# Patient Record
Sex: Male | Born: 1965 | Race: White | Hispanic: No | Marital: Married | Smoking: Current every day smoker
Health system: Southern US, Community
[De-identification: ages and names within clinical notes are randomized; demographics above are authoritative.]

## PROBLEM LIST (undated history)

## (undated) ENCOUNTER — Emergency Department: Admission: EM | Discharge status: Absent without leave | Payer: Self-pay

## (undated) DIAGNOSIS — F32A Depression, unspecified: Secondary | ICD-10-CM

## (undated) DIAGNOSIS — F1910 Other psychoactive substance abuse, uncomplicated: Secondary | ICD-10-CM

## (undated) DIAGNOSIS — R972 Elevated prostate specific antigen [PSA]: Secondary | ICD-10-CM

## (undated) DIAGNOSIS — R7303 Prediabetes: Secondary | ICD-10-CM

## (undated) DIAGNOSIS — Z8639 Personal history of other endocrine, nutritional and metabolic disease: Secondary | ICD-10-CM

## (undated) DIAGNOSIS — I10 Essential (primary) hypertension: Secondary | ICD-10-CM

## (undated) DIAGNOSIS — G709 Myoneural disorder, unspecified: Secondary | ICD-10-CM

## (undated) DIAGNOSIS — I739 Peripheral vascular disease, unspecified: Secondary | ICD-10-CM

## (undated) DIAGNOSIS — F1010 Alcohol abuse, uncomplicated: Secondary | ICD-10-CM

## (undated) HISTORY — PX: TONSILLECTOMY: SUR1361

## (undated) HISTORY — DX: Elevated prostate specific antigen (PSA): R97.20

## (undated) HISTORY — PX: NO PAST SURGERIES: SHX2092

---

## 2001-05-03 ENCOUNTER — Emergency Department (HOSPITAL_COMMUNITY): Admission: EM | Admit: 2001-05-03 | Discharge: 2001-05-03 | Payer: Self-pay | Admitting: Emergency Medicine

## 2007-09-11 ENCOUNTER — Ambulatory Visit: Payer: Self-pay | Admitting: Internal Medicine

## 2007-09-11 DIAGNOSIS — N400 Enlarged prostate without lower urinary tract symptoms: Secondary | ICD-10-CM

## 2008-05-09 ENCOUNTER — Ambulatory Visit: Payer: Self-pay | Admitting: Internal Medicine

## 2008-05-09 DIAGNOSIS — K625 Hemorrhage of anus and rectum: Secondary | ICD-10-CM

## 2008-05-09 DIAGNOSIS — R319 Hematuria, unspecified: Secondary | ICD-10-CM

## 2008-05-09 DIAGNOSIS — F17200 Nicotine dependence, unspecified, uncomplicated: Secondary | ICD-10-CM | POA: Insufficient documentation

## 2008-05-10 ENCOUNTER — Encounter (INDEPENDENT_AMBULATORY_CARE_PROVIDER_SITE_OTHER): Payer: Self-pay | Admitting: *Deleted

## 2008-05-12 ENCOUNTER — Encounter (INDEPENDENT_AMBULATORY_CARE_PROVIDER_SITE_OTHER): Payer: Self-pay | Admitting: *Deleted

## 2008-09-22 ENCOUNTER — Ambulatory Visit: Payer: Self-pay | Admitting: Internal Medicine

## 2008-09-22 DIAGNOSIS — F329 Major depressive disorder, single episode, unspecified: Secondary | ICD-10-CM | POA: Insufficient documentation

## 2009-05-05 ENCOUNTER — Telehealth (INDEPENDENT_AMBULATORY_CARE_PROVIDER_SITE_OTHER): Payer: Self-pay | Admitting: *Deleted

## 2009-05-09 ENCOUNTER — Telehealth (INDEPENDENT_AMBULATORY_CARE_PROVIDER_SITE_OTHER): Payer: Self-pay | Admitting: *Deleted

## 2009-05-15 ENCOUNTER — Telehealth (INDEPENDENT_AMBULATORY_CARE_PROVIDER_SITE_OTHER): Payer: Self-pay | Admitting: *Deleted

## 2011-01-27 LAB — CONVERTED CEMR LAB
AST: 24 units/L (ref 0–37)
Alkaline Phosphatase: 70 units/L (ref 39–117)
Basophils Absolute: 0.1 10*3/uL (ref 0.0–0.1)
Bilirubin, Direct: 0.1 mg/dL (ref 0.0–0.3)
Chloride: 106 meq/L (ref 96–112)
Eosinophils Absolute: 0.1 10*3/uL (ref 0.0–0.7)
GFR calc non Af Amer: 100 mL/min
HDL: 31.1 mg/dL — ABNORMAL LOW (ref >39.0)
Hgb A1c MFr Bld: 5.5 % (ref 4.6–6.0)
Lymphocytes Relative: 27.3 % (ref 12.0–46.0)
MCHC: 33.7 g/dL (ref 30.0–36.0)
MCV: 95.8 fL (ref 78.0–100.0)
Neutrophils Relative %: 60.9 % (ref 43.0–77.0)
Platelets: 266 10*3/uL (ref 150–400)
Potassium: 3.7 meq/L (ref 3.5–5.1)
Sodium: 139 meq/L (ref 135–145)
Total Bilirubin: 0.8 mg/dL (ref 0.3–1.2)
Triglycerides: 136 mg/dL (ref 0–149)
VLDL: 27 mg/dL (ref 0–40)
WBC: 4.7 10*3/uL (ref 4.5–10.5)

## 2016-01-15 ENCOUNTER — Ambulatory Visit (INDEPENDENT_AMBULATORY_CARE_PROVIDER_SITE_OTHER): Payer: BLUE CROSS/BLUE SHIELD

## 2016-01-15 ENCOUNTER — Ambulatory Visit (INDEPENDENT_AMBULATORY_CARE_PROVIDER_SITE_OTHER): Payer: BLUE CROSS/BLUE SHIELD | Admitting: Sports Medicine

## 2016-01-15 VITALS — BP 148/97 | HR 117 | Temp 98.0°F | Resp 18 | Wt 184.7 lb

## 2016-01-15 DIAGNOSIS — S59902A Unspecified injury of left elbow, initial encounter: Secondary | ICD-10-CM | POA: Diagnosis not present

## 2016-01-15 DIAGNOSIS — M25522 Pain in left elbow: Secondary | ICD-10-CM | POA: Diagnosis not present

## 2016-01-15 NOTE — Progress Notes (Addendum)
   Subjective:    I'm seeing this patient as a consultation for:  Dr. Marga MelnickWilliam Hopper  CC: Left elbow injury  HPI: Yesterday this pleasant 50 year old male fell backwards onto his left elbow impacting just proximal to the olecranon. He had immediate pain, but swelling and bruising came subsequently. No constitutional symptoms, however has exquisite pain and swelling proximal to the olecranon over the distal triceps. Pain does not radiate. Made better by rest, worse with resisted extension of the elbow.  Past medical history, Surgical history, Family history not pertinant except as noted below, Social history, Allergies, and medications have been entered into the medical record, reviewed, and no changes needed.   Review of Systems: No headache, visual changes, nausea, vomiting, diarrhea, constipation, dizziness, abdominal pain, skin rash, fevers, chills, night sweats, weight loss, swollen lymph nodes, body aches, joint swelling, muscle aches, chest pain, shortness of breath, mood changes, visual or auditory hallucinations.   Objective:   General: Well Developed, well nourished, and in no acute distress.  Neuro/Psych: Alert and oriented x3, extra-ocular muscles intact, able to move all 4 extremities, sensation grossly intact. Skin: Warm and dry, no rashes noted.  Respiratory: Not using accessory muscles, speaking in full sentences, trachea midline.  Cardiovascular: Pulses palpable, no extremity edema. Abdomen: Does not appear distended. Left Elbow: Visibly swelling proximal to the olecranon over the distal triceps, able to flex the elbow actively, but unable to extend against resistance. No palpable elbow joint effusion. Range of motion full pronation, supination, flexion, extension. Stable to varus, valgus stress. Negative moving valgus stress test. No discrete areas of tenderness to palpation. Ulnar nerve does not sublux. Negative cubital tunnel Tinel's.  Procedure: Diagnostic Ultrasound  of  left elbow Device: GE Logiq E  Findings: Noted hypoechoic change around the distal triceps tendon, there is also an interruption of the normal pennate structure of the distal triceps at the distal lateral head,  no visible gaps in the tendon.  Images permanently stored and available for review in the ultrasound unit.  Impression: Distal triceps tendinitis with possible partial tear of the distal lateral head of the triceps at the musculotendinous junction, no joint effusion to suggest articular injury, and no visible olecranon bursa collection.   Procedure: Real-time Ultrasound Guided Injection of left triceps tendon sheath Device: GE Logiq E  Verbal informed consent obtained.  Time-out conducted.  Noted no overlying erythema, induration, or other signs of local infection.  Skin prepped in a sterile fashion.  Local anesthesia: Topical Ethyl chloride.  With sterile technique and under real time ultrasound guidance:  25-gauge daily advanced into the tendon sheath effusion , 1 mL kenalog 40, 2 mL lidocaine, 2 mL Marcaine injected easily. Completed without difficulty  Pain immediately resolved suggesting accurate placement of the medication. Patient also regained good ability to extend the elbow against resistance  Advised to call if fevers/chills, erythema, induration, drainage, or persistent bleeding.  Images permanently stored and available for review in the ultrasound unit.  Impression: Technically successful ultrasound guided injection.  Impression and Recommendations:   This case required medical decision making of moderate complexity.

## 2016-01-15 NOTE — Assessment & Plan Note (Addendum)
No visible fractures but there is what appears to be triceps tendinitis distally, with some partial tearing. Injection performed, elbow sleeve, sling. Good ability to extend the elbow against resistance after the injection suggests no high-grade tear of the triceps. Home PT. Return in one month.

## 2016-02-12 ENCOUNTER — Ambulatory Visit: Payer: BLUE CROSS/BLUE SHIELD | Admitting: Sports Medicine

## 2016-07-05 ENCOUNTER — Emergency Department (INDEPENDENT_AMBULATORY_CARE_PROVIDER_SITE_OTHER): Payer: BLUE CROSS/BLUE SHIELD

## 2016-07-05 ENCOUNTER — Encounter: Payer: Self-pay | Admitting: Emergency Medicine

## 2016-07-05 ENCOUNTER — Emergency Department (INDEPENDENT_AMBULATORY_CARE_PROVIDER_SITE_OTHER)
Admission: EM | Admit: 2016-07-05 | Discharge: 2016-07-05 | Disposition: A | Discharge status: Home / Self Care | Payer: BLUE CROSS/BLUE SHIELD | Source: Home / Self Care | Attending: Family Medicine | Admitting: Family Medicine

## 2016-07-05 DIAGNOSIS — M542 Cervicalgia: Secondary | ICD-10-CM

## 2016-07-05 DIAGNOSIS — M25512 Pain in left shoulder: Secondary | ICD-10-CM | POA: Diagnosis not present

## 2016-07-05 MED ORDER — HYDROCODONE-ACETAMINOPHEN 5-325 MG PO TABS: ORAL_TABLET | ORAL | Status: DC

## 2016-07-05 MED ORDER — PREDNISONE 20 MG PO TABS: ORAL_TABLET | ORAL | Status: DC

## 2016-07-05 NOTE — ED Provider Notes (Signed)
CSN: [id number]     Arrival date & time 07/05/16  1358 History   First MD Initiated Contact with Patient 07/05/16 1400     Chief Complaint  Patient presents with  . Weakness      HPI Comments: About one week ago patient "pulled a muscle" in his left scapular area.  The pain initially was intermittent, and is now a constant ache radiating into his left shoulder and arm.   He denies chest pain, nausea, or shortness of breath.  His pain is independent of activity and awakens him at night.  He states that his BP is normally lower than today's measurement.  He is worried about the possibility of heart disease. He currently smokes 1 ppd cigarettes. Family history of stroke in a maternal grandmother.  Patient is a 50 y.o. male presenting with shoulder pain. The history is provided by the patient.  Shoulder Pain Location:  Shoulder Time since incident:  1 week Injury: yes   Mechanism of injury comment:  Muscle strain Shoulder location:  L shoulder Pain details:    Quality:  Aching   Radiates to:  L arm (left neck)   Severity:  Mild   Onset quality:  Sudden   Duration:  1 week   Timing:  Constant   Progression:  Worsening Chronicity:  New Prior injury to area:  No Relieved by:  Nothing Worsened by:  Movement Ineffective treatments:  None tried Associated symptoms: fatigue, neck pain and stiffness   Associated symptoms: no back pain, no decreased range of motion, no fever, no numbness, no swelling and no tingling     History reviewed. No pertinent past medical history. History reviewed. No pertinent past surgical history. No family history on file. Social History  Substance Use Topics  . Smoking status: Current Every Day Smoker -- 1.00 packs/day for 30 years    Types: Cigarettes  . Smokeless tobacco: None  . Alcohol Use: Yes    Review of Systems  Constitutional: Positive for fatigue. Negative for fever, chills, diaphoresis, activity change and appetite change.       Feels "hot"    HENT: Negative.   Eyes: Negative.   Respiratory: Negative for cough, chest tightness, shortness of breath and wheezing.   Cardiovascular: Negative for chest pain, palpitations and leg swelling.  Gastrointestinal: Negative.   Genitourinary: Negative.   Musculoskeletal: Positive for stiffness, neck pain and neck stiffness. Negative for back pain.  Skin: Negative.   Neurological: Negative for headaches.  All other systems reviewed and are negative.   Allergies  Tetanus toxoid  Home Medications   Prior to Admission medications   Medication Sig Start Date End Date Taking? Authorizing Provider  HYDROcodone-acetaminophen (NORCO/VICODIN) 5-325 MG tablet Take one by mouth at bedtime as needed for pain 07/05/16   Lattie HawStephen A Sutton Hirsch, MD  PARoxetine HCl (PAXIL PO) Take by mouth.    Historical Provider, MD  predniSONE (DELTASONE) 20 MG tablet Take one tab by mouth twice daily for 5 days, then one daily. Take with food. 07/05/16   Lattie HawStephen A Luddie Boghosian, MD   Meds Ordered and Administered this Visit  Medications - No data to display  BP 181/111 mmHg  Pulse 103  Temp(Src) 98.2 F (36.8 C) (Oral)  Ht 6\' 1"  (1.854 m)  Wt 188 lb (85.276 kg)  BMI 24.81 kg/m2  SpO2 97% No data found.   Physical Exam  Constitutional: He is oriented to person, place, and time. He appears well-developed and well-nourished. No distress.  HENT:  Head: Normocephalic.  Right Ear: External ear normal.  Left Ear: External ear normal.  Nose: Nose normal.  Mouth/Throat: Oropharynx is clear and moist.  Eyes: Conjunctivae are normal. Pupils are equal, round, and reactive to light.  Neck: Neck supple. Decreased range of motion present.    Left neck has tenderness to palpation on the left radiating to left trapezius and left rhomboid area as noted on diagram.    Cardiovascular: Normal heart sounds.   Pulmonary/Chest: Breath sounds normal.  Abdominal: There is no tenderness.  Musculoskeletal: He exhibits no edema.       Left  shoulder: Normal.       Back:  There is distinct tenderness over medial and inferior edges of left scapula.  Pain elicited by resisted abduction of left shoulder while palpating left rhomboid muscles.   Lymphadenopathy:    He has no cervical adenopathy.  Neurological: He is alert and oriented to person, place, and time. No cranial nerve deficit.  Skin: Skin is warm and dry. No rash noted.  Nursing note and vitals reviewed.   ED Course  Procedures none  EKG: Rate:  118 BPM PR:  152 msec QT:  322 msec QTcH:  423 msec QRSD:  101 msec QRS axis:  -19 degrees Interpretation:  Sinus tachycardia, otherwise within normal limits.     Comparison with EKG done 05/09/2008:  No significant changes    Imaging Review Dg Chest 2 View  07/05/2016  CLINICAL DATA:  Left neck and shoulder pain. EXAM: CHEST  2 VIEW COMPARISON:  None. FINDINGS: Normal heart size. Normal mediastinal contour. No pneumothorax. No pleural effusion. Biapical symmetric pleural parenchymal scarring. No pulmonary edema. No acute consolidative airspace disease. Mild thoracic spondylosis. IMPRESSION: No active cardiopulmonary disease. Electronically Signed   By: Delbert PhenixJason A Poff M.D.   On: 07/05/2016 14:44   Dg Cervical Spine Complete  07/05/2016  CLINICAL DATA:  Left shoulder pain, left neck pain for 1 week, no known injury EXAM: CERVICAL SPINE - COMPLETE 4+ VIEW COMPARISON:  None FINDINGS: Six views of the cervical spine submitted. No acute fracture or subluxation there is moderate disc space flattening with mild anterior spurring at C3-C4 and C6-C7 level. Minimal disc space flattening at C4-C5 and C5-C6 level. No prevertebral soft tissue swelling. Cervical airway is patent. Mild degenerative changes C1-C2 articulation. IMPRESSION: No acute fracture or subluxation. Multilevel degenerative changes as described above. Electronically Signed   By: Natasha MeadLiviu  Pop M.D.   On: 07/05/2016 15:01      MDM   1. Shoulder pain, left   2. Neck pain on  left side; ?cervical radiculopathy    No evidence ACS.  Normal EKG today reassuring.  However patient has significant risk factors for ASCVD.  Repeat BP 142/91.  Recommend monitoring BP and recording.  Followup with PCP if remains elevated. Begin prednisone burst/taper.  Lortab for pain at bedtime. Apply ice pack to left neck and shoulder for 15 to 20 minutes, 3 to 4 times daily  Continue until pain decreases.  Followup with Dr. Rodney Langtonhomas Thekkekandam or Dr. Clementeen GrahamEvan Corey ([facility]) if neck/shoulder pain not improving about two weeks.  Recommend follow-up with cardiologist for exercise stress test.    Lattie HawStephen A Kirkland Figg, MD 07/14/16 1226

## 2016-07-05 NOTE — Discharge Instructions (Signed)
Apply ice pack to left neck and shoulder for 15 to 20 minutes, 3 to 4 times daily  Continue until pain decreases.    Radicular Pain Radicular pain in either the arm or leg is usually from a bulging or herniated disk in the spine. A piece of the herniated disk may press against the nerves as the nerves exit the spine. This causes pain which is felt at the tips of the nerves down the arm or leg. Other causes of radicular pain may include:  Fractures.  Heart disease.  Cancer.  An abnormal and usually degenerative state of the nervous system or nerves (neuropathy). Diagnosis may require CT or MRI scanning to determine the primary cause.  Nerves that start at the neck (nerve roots) may cause radicular pain in the outer shoulder and arm. It can spread down to the thumb and fingers. The symptoms vary depending on which nerve root has been affected. In most cases radicular pain improves with conservative treatment. Neck problems may require physical therapy, a neck collar, or cervical traction. Treatment may take many weeks, and surgery may be considered if the symptoms do not improve.  Conservative treatment is also recommended for sciatica. Sciatica causes pain to radiate from the lower back or buttock area down the leg into the foot. Often there is a history of back problems. Most patients with sciatica are better after 2 to 4 weeks of rest and other supportive care. Short term bed rest can reduce the disk pressure considerably. Sitting, however, is not a good position since this increases the pressure on the disk. You should avoid bending, lifting, and all other activities which make the problem worse. Traction can be used in severe cases. Surgery is usually reserved for patients who do not improve within the first months of treatment. Only take over-the-counter or prescription medicines for pain, discomfort, or fever as directed by your caregiver. Narcotics and muscle relaxants may help by relieving  more severe pain and spasm and by providing mild sedation. Cold or massage can give significant relief. Spinal manipulation is not recommended. It can increase the degree of disc protrusion. Epidural steroid injections are often effective treatment for radicular pain. These injections deliver medicine to the spinal nerve in the space between the protective covering of the spinal cord and back bones (vertebrae). Your caregiver can give you more information about steroid injections. These injections are most effective when given within two weeks of the onset of pain.  You should see your caregiver for follow up care as recommended. A program for neck and back injury rehabilitation with stretching and strengthening exercises is an important part of management.  SEEK IMMEDIATE MEDICAL CARE IF:  You develop increased pain, weakness, or numbness in your arm or leg.  You develop difficulty with bladder or bowel control.  You develop abdominal pain.   This information is not intended to replace advice given to you by your health care provider. Make sure you discuss any questions you have with your health care provider.   Document Released: 01/23/2005 Document Revised: 01/06/2015 Document Reviewed: 07/12/2015 Elsevier Interactive Patient Education [employer]2016 [employer] Inc[employer]

## 2016-07-05 NOTE — ED Notes (Signed)
About one week ago started having left shoulder pain, today radiating down into left arm and weakness. Denies chest pain, nausea, BP is elevated

## 2018-11-04 ENCOUNTER — Encounter: Payer: Self-pay | Admitting: Emergency Medicine

## 2018-11-04 ENCOUNTER — Other Ambulatory Visit: Payer: Self-pay

## 2018-11-04 ENCOUNTER — Emergency Department (INDEPENDENT_AMBULATORY_CARE_PROVIDER_SITE_OTHER)
Admission: EM | Admit: 2018-11-04 | Discharge: 2018-11-04 | Disposition: A | Discharge status: Home / Self Care | Payer: BLUE CROSS/BLUE SHIELD | Source: Home / Self Care | Attending: Family Medicine | Admitting: Family Medicine

## 2018-11-04 DIAGNOSIS — R1011 Right upper quadrant pain: Secondary | ICD-10-CM | POA: Diagnosis not present

## 2018-11-04 DIAGNOSIS — R197 Diarrhea, unspecified: Secondary | ICD-10-CM

## 2018-11-04 DIAGNOSIS — R531 Weakness: Secondary | ICD-10-CM

## 2018-11-04 NOTE — ED Triage Notes (Signed)
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  He has been suffering from diarrhea, weakness, and RLQ discomfort for several months.  Pt is concerned for liver damage, stating he woke up this morning with his eyes looking yellow.

## 2018-11-04 NOTE — Discharge Instructions (Signed)
°  Your labs should results by tomorrow. You will be notified even if they are all normal.  Please follow up with your family medicine doctor as needed.

## 2018-11-04 NOTE — ED Provider Notes (Signed)
[facility]    CSN: [id number] Arrival date & time: 11/04/18  0948     History   Chief Complaint Chief Complaint  Patient presents with  . Weakness  . Abdominal Pain  . Diarrhea    HPI Julian Simmons. is a 52 y.o. male.   HPI  Julian Simmons. is a 52 y.o. male presenting to UC with c/o several months of intermittent watery diarrhea, generalized weakness and Right side abdominal pain, discomfort.  He reports being a covering alcoholic, sober for 2 years. He is concerned he has liver damage from his hx of heavy drinking. He thought his eyes looked yellow this morning.  Denies abdominal pain or nausea at this time. He does have a PCP but has not addressed these concerns with him.    History reviewed. No pertinent past medical history.  Patient Active Problem List   Diagnosis Date Noted  . Injury of left elbow 01/15/2016  . DEPRESSIVE DISORDER 09/22/2008  . CIGARETTE SMOKER 05/09/2008  . RECTAL BLEEDING 05/09/2008  . HEMATURIA 05/09/2008  . 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 No pertinent surgical history.     Home Medications    Prior to Admission medications   Medication Sig Start Date End Date Taking? Authorizing Provider  PARoxetine HCl (PAXIL PO) Take by mouth.   Yes [provider]    Family History History reviewed. No pertinent family history.  Social History Social History   Tobacco Use  . Smoking status: Current Every Day Smoker    Packs/day: 1.00    Years: 30.00    Pack years: 30.00    Types: Cigarettes  . Smokeless tobacco: Never Used  Substance Use Topics  . Alcohol use: Not Currently    Comment: Pt quit 2 years ago  . Drug use: Not on file     Allergies   Tetanus toxoid   Review of Systems Review of Systems  Constitutional: Positive for fatigue. Negative for appetite change, chills, diaphoresis, fever and unexpected weight change.  Gastrointestinal: Positive for  abdominal pain and diarrhea. Negative for nausea and vomiting.  Neurological: Positive for weakness. Negative for dizziness, seizures, light-headedness, numbness and headaches.     Physical Exam Triage Vital Signs ED Triage Vitals [11/04/18 1007]  Enc Vitals Group     BP 139/90     Pulse Rate (!) 107     Resp      Temp 98.3 F (36.8 C)     Temp Source Oral     SpO2 98 %     Weight 189 lb 4 oz (85.8 kg)     Height 6\' 1"  (1.854 m)     Head Circumference      Peak Flow      Pain Score 2     Pain Loc      Pain Edu?      Excl. in GC?    No data found.  Updated Vital Signs BP 139/90 (BP Location: Right Arm)   Pulse (!) 107   Temp 98.3 F (36.8 C) (Oral)   Ht 6\' 1"  (1.854 m)   Wt 189 lb 4 oz (85.8 kg)   SpO2 98%   BMI 24.97 kg/m   Visual Acuity Right Eye Distance:   Left Eye Distance:   Bilateral Distance:    Right Eye Near:   Left Eye Near:    Bilateral Near:     Physical Exam  Constitutional: He  is oriented to person, place, and time. He appears well-developed and well-nourished.  HENT:  Head: Normocephalic and atraumatic.  Mouth/Throat: Oropharynx is clear and moist.  Eyes: EOM are normal.  Neck: Normal range of motion.  Cardiovascular: Normal rate and regular rhythm.  Pulmonary/Chest: Effort normal and breath sounds normal.  Abdominal: Soft. Normal appearance. There is no splenomegaly or hepatomegaly. There is no tenderness. There is no CVA tenderness and negative Murphy's sign.  Musculoskeletal: Normal range of motion.  Neurological: He is alert and oriented to person, place, and time.  Skin: Skin is warm and dry.  Psychiatric: He has a normal mood and affect. His behavior is normal.  Nursing note and vitals reviewed.    UC Treatments / Results  Labs (all labs ordered are listed, but only abnormal results are displayed) Labs Reviewed  CBC WITH DIFFERENTIAL/PLATELET  COMPLETE METABOLIC PANEL WITH GFR  LIPASE    EKG None  Radiology No results  found.  Procedures Procedures (including critical care time)  Medications Ordered in UC Medications - No data to display  Initial Impression / Assessment and Plan / UC Course  I have reviewed the triage vital signs and the nursing notes.  Pertinent labs & imaging results that were available during my care of the patient were reviewed by me and considered in my medical decision making (see chart for details).    Normal exam, no evidence of acute abdomen at this time. Will order labs and f/u with pt.   Final Clinical Impressions(s) / UC Diagnoses   Final diagnoses:  Right upper quadrant abdominal pain  Diarrhea, unspecified type  Weakness     Discharge Instructions      Your labs should results by tomorrow. You will be notified even if they are all normal.  Please follow up with your family medicine doctor as needed.     ED Prescriptions    None     Controlled Substance Prescriptions [place] Controlled Substance Registry consulted? Not Applicable   Rolla Plate 11/04/18 1105

## 2018-11-05 ENCOUNTER — Telehealth: Payer: Self-pay

## 2018-11-05 LAB — CBC WITH DIFFERENTIAL/PLATELET
Basophils Absolute: 30 cells/uL (ref 0–200)
Basophils Relative: 0.6 %
Eosinophils Absolute: 40 cells/uL (ref 15–500)
Eosinophils Relative: 0.8 %
HCT: 45.1 % (ref 38.5–50.0)
Hemoglobin: 15.9 g/dL (ref 13.2–17.1)
Lymphs Abs: 1040 cells/uL (ref 850–3900)
MCH: 30.3 pg (ref 27.0–33.0)
MCHC: 35.3 g/dL (ref 32.0–36.0)
MCV: 85.9 fL (ref 80.0–100.0)
MPV: 11.2 fL (ref 7.5–12.5)
Monocytes Relative: 6.5 %
Neutro Abs: 3565 cells/uL (ref 1500–7800)
Neutrophils Relative %: 71.3 %
Platelets: 132 10*3/uL — ABNORMAL LOW (ref 140–400)
RBC: 5.25 10*6/uL (ref 4.20–5.80)
RDW: 12.9 % (ref 11.0–15.0)
Total Lymphocyte: 20.8 %
WBC mixed population: 325 cells/uL (ref 200–950)
WBC: 5 10*3/uL (ref 3.8–10.8)

## 2018-11-05 LAB — COMPLETE METABOLIC PANEL WITH GFR
AG Ratio: 2 (calc) (ref 1.0–2.5)
ALT: 25 U/L (ref 9–46)
AST: 22 U/L (ref 10–35)
Albumin: 4.7 g/dL (ref 3.6–5.1)
Alkaline phosphatase (APISO): 80 U/L (ref 40–115)
BUN: 10 mg/dL (ref 7–25)
CO2: 23 mmol/L (ref 20–32)
Calcium: 9.8 mg/dL (ref 8.6–10.3)
Chloride: 101 mmol/L (ref 98–110)
Creat: 1.13 mg/dL (ref 0.70–1.33)
GFR, Est African American: 87 mL/min/{1.73_m2} (ref >=60)
GFR, Est Non African American: 75 mL/min/{1.73_m2} (ref >=60)
Globulin: 2.4 g/dL (calc) (ref 1.9–3.7)
Glucose, Bld: 166 mg/dL — ABNORMAL HIGH (ref 65–99)
Potassium: 4.2 mmol/L (ref 3.5–5.3)
Sodium: 133 mmol/L — ABNORMAL LOW (ref 135–146)
Total Bilirubin: 0.3 mg/dL (ref 0.2–1.2)
Total Protein: 7.1 g/dL (ref 6.1–8.1)

## 2018-11-05 LAB — LIPASE: Lipase: 20 U/L (ref 7–60)

## 2018-11-05 NOTE — Telephone Encounter (Signed)
Pt states he is feeling about the same. No worsening of symptoms. Given lab results. Advised to f/u with PCP in a couple days if he feels no improvement.

## 2019-10-15 ENCOUNTER — Ambulatory Visit (INDEPENDENT_AMBULATORY_CARE_PROVIDER_SITE_OTHER): Payer: BC Managed Care – PPO | Admitting: Urology

## 2019-10-15 ENCOUNTER — Encounter: Payer: Self-pay | Admitting: Urology

## 2019-10-15 ENCOUNTER — Other Ambulatory Visit: Payer: Self-pay

## 2019-10-15 VITALS — BP 154/99 | HR 106 | Ht 73.0 in | Wt 182.6 lb

## 2019-10-15 DIAGNOSIS — R972 Elevated prostate specific antigen [PSA]: Secondary | ICD-10-CM

## 2019-10-15 NOTE — Progress Notes (Signed)
10/15/2019 3:11 PM   Julian Simmons. 1966-06-30 [id number]  Referring provider: Tamsen Roers, [place],  [place] [address]  Chief Complaint  Patient presents with  . Elevated PSA    HPI: Julian Simmons is a 53 y.o. male seen in consultation at the request of Dr. Rex Kras for a PSA of 3.9 which was drawn on August 2020.  For the past 6 months he has noted moderate lower urinary tract symptoms including decreased force and caliber of urinary stream and nocturia x3.  IPSS completed today was 10/35 with a QoL rated 3/6.  Denies dysuria or gross hematuria.  He has no flank, abdominal, pelvic or scrotal pain.  No family history of prostate cancer.  Past urologic history remarkable for a "prostate infection" in his 1s and microhematuria evaluation approximately 20 years ago in [place].  He had cystoscopy x2 which was unremarkable.  PMH: Past Medical History:  Diagnosis Date  . Elevated PSA     Surgical History: No past surgical history on file.  Home Medications:  Allergies as of 10/15/2019      Reactions   Tetanus Toxoid       Medication List       Accurate as of October 15, 2019  3:11 PM. If you have any questions, ask your nurse or doctor.        hydrochlorothiazide 12.5 MG capsule Commonly known as: MICROZIDE TAKE 1 CAPSULE BY MOUTH ONCE DAILY IN THE MORNING   lisinopril 5 MG tablet Commonly known as: ZESTRIL Take 5 mg by mouth daily.   lovastatin 10 MG tablet Commonly known as: MEVACOR TAKE 1 TABLET BY MOUTH ONCE DAILY WITH EVENING MEAL   PARoxetine 30 MG tablet Commonly known as: PAXIL Take 30 mg by mouth daily.   sildenafil 100 MG tablet Commonly known as: VIAGRA Take 100 mg by mouth as needed.   temazepam 15 MG capsule Commonly known as: RESTORIL TAKE 1 CAPSULE BY MOUTH ONCE DAILY AT BEDTIME AS NEEDED       Allergies:  Allergies  Allergen Reactions  . Tetanus Toxoid     Family History: No family history on file.  Social  History:  reports that he has been smoking cigarettes. He has a 30.00 pack-year smoking history. He has never used smokeless tobacco. He reports current alcohol use. He reports that he does not use drugs.  ROS: UROLOGY Frequent Urination?: No Hard to postpone urination?: No Burning/pain with urination?: No Get up at night to urinate?: Yes Leakage of urine?: No Urine stream starts and stops?: Yes Trouble starting stream?: No Do you have to strain to urinate?: No Blood in urine?: No Urinary tract infection?: No Sexually transmitted disease?: No Injury to kidneys or bladder?: No Painful intercourse?: No Weak stream?: Yes Erection problems?: No Penile pain?: No  Gastrointestinal Nausea?: No Vomiting?: No Indigestion/heartburn?: No Diarrhea?: No Constipation?: No  Constitutional Fever: No Night sweats?: No Weight loss?: No Fatigue?: No  Skin Skin rash/lesions?: No Itching?: No  Eyes Blurred vision?: No Double vision?: No  Ears/Nose/Throat Sore throat?: No Sinus problems?: No  Hematologic/Lymphatic Swollen glands?: No Easy bruising?: No  Cardiovascular Leg swelling?: No Chest pain?: No  Respiratory Cough?: No Shortness of breath?: No  Endocrine Excessive thirst?: No  Musculoskeletal Back pain?: No Joint pain?: No  Neurological Headaches?: No Dizziness?: No  Psychologic Depression?: No Anxiety?: No  Physical Exam: BP (!) 154/99 (BP Location: Left Arm, Patient Position: Sitting, Cuff Size: Normal)  Pulse (!) 106   Ht 6\' 1"  (1.854 m)   Wt 182 lb 9.6 oz (82.8 kg)   BMI 24.09 kg/m   Constitutional:  Alert and oriented, No acute distress. HEENT: [place] AT, moist mucus membranes.  Trachea midline, no masses. Cardiovascular: No clubbing, cyanosis, or edema. Respiratory: Normal respiratory effort, no increased work of breathing. GI: Abdomen is soft, nontender, nondistended, no abdominal masses GU: Prostate 50 g, smooth without nodules. Lymph: No  cervical or inguinal lymphadenopathy. Skin: No rashes, bruises or suspicious lesions. Neurologic: Grossly intact, no focal deficits, moving all 4 extremities. Psychiatric: Normal mood and affect.   Assessment & Plan:    - Elevated PSA Mildly elevated PSA by age-specific guidelines.  He has moderate lower urinary tract symptoms.  I initially recommended a 30-day alpha-blocker trial with a repeat PSA in approximately 1 month.  Although PSA is a prostate cancer screening test he was informed that cancer is not the most common cause of an elevated PSA. Other potential causes including BPH and inflammation were discussed. He was informed that the only way to adequately diagnose prostate cancer would be a transrectal ultrasound and biopsy of the prostate. The procedure was discussed including potential risks of bleeding and infection/sepsis. He was also informed that a negative biopsy does not conclusively rule out the possibility that prostate cancer may be present and that continued monitoring is required. The use of newer adjunctive blood tests including PHI and 4kScore were discussed. The use of multiparametric prostate MRI was also discussed however is not typically used for initial evaluation of an elevated PSA. Continued periodic surveillance was also discussed.   , MD  [facility] [address], [address] [place], [place] [phone]

## 2019-10-17 ENCOUNTER — Encounter: Payer: Self-pay | Admitting: Urology

## 2019-10-18 ENCOUNTER — Telehealth: Payer: Self-pay | Admitting: Urology

## 2019-10-18 MED ORDER — TAMSULOSIN HCL 0.4 MG PO CAPS: 0.4000 mg | ORAL_CAPSULE | Freq: Every day | ORAL | 0 refills | Status: DC

## 2019-10-18 NOTE — Telephone Encounter (Signed)
You saw this patient on Friday and he stated that you were going to call him in a medication but I don't see where that was done. He said he wants it called into the [facility] on [address].   Thanks, Sharyn Lull

## 2019-11-12 ENCOUNTER — Other Ambulatory Visit: Payer: Self-pay | Admitting: Family Medicine

## 2019-11-12 DIAGNOSIS — R972 Elevated prostate specific antigen [PSA]: Secondary | ICD-10-CM

## 2019-11-15 ENCOUNTER — Other Ambulatory Visit: Payer: BC Managed Care – PPO

## 2019-11-15 ENCOUNTER — Other Ambulatory Visit: Payer: Self-pay

## 2019-11-15 DIAGNOSIS — R972 Elevated prostate specific antigen [PSA]: Secondary | ICD-10-CM

## 2019-11-16 LAB — PSA: Prostate Specific Ag, Serum: 3.2 ng/mL (ref 0.0–4.0)

## 2019-11-22 ENCOUNTER — Telehealth: Payer: Self-pay

## 2019-11-22 NOTE — Telephone Encounter (Signed)
Patient notified

## 2019-11-22 NOTE — Telephone Encounter (Signed)
-----   Message from Abbie Sons, MD sent at 11/21/2019  7:02 PM EST ----- Repeat PSA was 3.2 which is normal for age.  Recommend he continue annual PSA testing with his PCP.

## 2020-01-15 DIAGNOSIS — Z20822 Contact with and (suspected) exposure to covid-19: Secondary | ICD-10-CM | POA: Diagnosis not present

## 2020-03-29 DIAGNOSIS — I10 Essential (primary) hypertension: Secondary | ICD-10-CM | POA: Diagnosis not present

## 2020-03-29 DIAGNOSIS — F329 Major depressive disorder, single episode, unspecified: Secondary | ICD-10-CM | POA: Diagnosis not present

## 2020-03-29 DIAGNOSIS — E785 Hyperlipidemia, unspecified: Secondary | ICD-10-CM | POA: Diagnosis not present

## 2020-03-29 DIAGNOSIS — G4700 Insomnia, unspecified: Secondary | ICD-10-CM | POA: Diagnosis not present

## 2020-04-12 DIAGNOSIS — I10 Essential (primary) hypertension: Secondary | ICD-10-CM | POA: Diagnosis not present

## 2020-10-05 DIAGNOSIS — I10 Essential (primary) hypertension: Secondary | ICD-10-CM | POA: Diagnosis not present

## 2020-10-05 DIAGNOSIS — Z13220 Encounter for screening for lipoid disorders: Secondary | ICD-10-CM | POA: Diagnosis not present

## 2020-10-05 DIAGNOSIS — G4700 Insomnia, unspecified: Secondary | ICD-10-CM | POA: Diagnosis not present

## 2020-10-05 DIAGNOSIS — D529 Folate deficiency anemia, unspecified: Secondary | ICD-10-CM | POA: Diagnosis not present

## 2020-10-05 DIAGNOSIS — E559 Vitamin D deficiency, unspecified: Secondary | ICD-10-CM | POA: Diagnosis not present

## 2020-10-05 DIAGNOSIS — F329 Major depressive disorder, single episode, unspecified: Secondary | ICD-10-CM | POA: Diagnosis not present

## 2020-10-05 DIAGNOSIS — N529 Male erectile dysfunction, unspecified: Secondary | ICD-10-CM | POA: Diagnosis not present

## 2020-10-05 DIAGNOSIS — Z79899 Other long term (current) drug therapy: Secondary | ICD-10-CM | POA: Diagnosis not present

## 2020-10-05 DIAGNOSIS — E119 Type 2 diabetes mellitus without complications: Secondary | ICD-10-CM | POA: Diagnosis not present

## 2020-10-19 ENCOUNTER — Emergency Department: Payer: BC Managed Care – PPO

## 2020-10-19 ENCOUNTER — Other Ambulatory Visit: Payer: Self-pay

## 2020-10-19 ENCOUNTER — Encounter: Payer: Self-pay | Admitting: Radiology

## 2020-10-19 ENCOUNTER — Inpatient Hospital Stay
Admission: EM | Admit: 2020-10-19 | Discharge: 2020-10-21 | DRG: 103 | Disposition: A | Discharge status: Home / Self Care | Payer: BC Managed Care – PPO | Attending: Internal Medicine | Admitting: Internal Medicine

## 2020-10-19 DIAGNOSIS — H4902 Third [oculomotor] nerve palsy, left eye: Secondary | ICD-10-CM | POA: Diagnosis not present

## 2020-10-19 DIAGNOSIS — Z79899 Other long term (current) drug therapy: Secondary | ICD-10-CM

## 2020-10-19 DIAGNOSIS — E871 Hypo-osmolality and hyponatremia: Secondary | ICD-10-CM | POA: Diagnosis not present

## 2020-10-19 DIAGNOSIS — F32A Depression, unspecified: Secondary | ICD-10-CM | POA: Diagnosis not present

## 2020-10-19 DIAGNOSIS — E878 Other disorders of electrolyte and fluid balance, not elsewhere classified: Secondary | ICD-10-CM | POA: Diagnosis not present

## 2020-10-19 DIAGNOSIS — Z8673 Personal history of transient ischemic attack (TIA), and cerebral infarction without residual deficits: Secondary | ICD-10-CM

## 2020-10-19 DIAGNOSIS — H02402 Unspecified ptosis of left eyelid: Secondary | ICD-10-CM | POA: Diagnosis not present

## 2020-10-19 DIAGNOSIS — G43109 Migraine with aura, not intractable, without status migrainosus: Principal | ICD-10-CM | POA: Diagnosis present

## 2020-10-19 DIAGNOSIS — I639 Cerebral infarction, unspecified: Secondary | ICD-10-CM | POA: Diagnosis not present

## 2020-10-19 DIAGNOSIS — G809 Cerebral palsy, unspecified: Secondary | ICD-10-CM | POA: Diagnosis not present

## 2020-10-19 DIAGNOSIS — F1020 Alcohol dependence, uncomplicated: Secondary | ICD-10-CM | POA: Diagnosis not present

## 2020-10-19 DIAGNOSIS — H4900 Third [oculomotor] nerve palsy, unspecified eye: Secondary | ICD-10-CM | POA: Diagnosis present

## 2020-10-19 DIAGNOSIS — E1165 Type 2 diabetes mellitus with hyperglycemia: Secondary | ICD-10-CM | POA: Diagnosis present

## 2020-10-19 DIAGNOSIS — F17210 Nicotine dependence, cigarettes, uncomplicated: Secondary | ICD-10-CM | POA: Diagnosis present

## 2020-10-19 DIAGNOSIS — R519 Headache, unspecified: Secondary | ICD-10-CM | POA: Diagnosis not present

## 2020-10-19 DIAGNOSIS — I10 Essential (primary) hypertension: Secondary | ICD-10-CM | POA: Diagnosis present

## 2020-10-19 DIAGNOSIS — F17200 Nicotine dependence, unspecified, uncomplicated: Secondary | ICD-10-CM | POA: Diagnosis present

## 2020-10-19 DIAGNOSIS — Z20822 Contact with and (suspected) exposure to covid-19: Secondary | ICD-10-CM | POA: Diagnosis present

## 2020-10-19 DIAGNOSIS — H02401 Unspecified ptosis of right eyelid: Secondary | ICD-10-CM | POA: Diagnosis not present

## 2020-10-19 DIAGNOSIS — F1010 Alcohol abuse, uncomplicated: Secondary | ICD-10-CM | POA: Diagnosis present

## 2020-10-19 DIAGNOSIS — Q283 Other malformations of cerebral vessels: Secondary | ICD-10-CM | POA: Diagnosis not present

## 2020-10-19 DIAGNOSIS — G450 Vertebro-basilar artery syndrome: Secondary | ICD-10-CM | POA: Diagnosis not present

## 2020-10-19 DIAGNOSIS — E876 Hypokalemia: Secondary | ICD-10-CM | POA: Diagnosis present

## 2020-10-19 DIAGNOSIS — H532 Diplopia: Secondary | ICD-10-CM | POA: Diagnosis not present

## 2020-10-19 DIAGNOSIS — I672 Cerebral atherosclerosis: Secondary | ICD-10-CM | POA: Diagnosis not present

## 2020-10-19 DIAGNOSIS — G839 Paralytic syndrome, unspecified: Secondary | ICD-10-CM | POA: Diagnosis not present

## 2020-10-19 DIAGNOSIS — G8929 Other chronic pain: Secondary | ICD-10-CM | POA: Diagnosis present

## 2020-10-19 DIAGNOSIS — I6359 Cerebral infarction due to unspecified occlusion or stenosis of other cerebral artery: Secondary | ICD-10-CM | POA: Diagnosis not present

## 2020-10-19 DIAGNOSIS — G588 Other specified mononeuropathies: Secondary | ICD-10-CM | POA: Diagnosis not present

## 2020-10-19 LAB — CBC WITH DIFFERENTIAL/PLATELET
Abs Immature Granulocytes: 0.03 10*3/uL (ref 0.00–0.07)
Basophils Absolute: 0 10*3/uL (ref 0.0–0.1)
Basophils Relative: 1 %
Eosinophils Absolute: 0.1 10*3/uL (ref 0.0–0.5)
Eosinophils Relative: 1 %
Hemoglobin: 18 g/dL — ABNORMAL HIGH (ref 13.0–17.0)
Immature Granulocytes: 0 %
Lymphocytes Relative: 26 %
Lymphs Abs: 2.1 10*3/uL (ref 0.7–4.0)
Monocytes Absolute: 0.9 10*3/uL (ref 0.1–1.0)
Monocytes Relative: 12 %
Neutro Abs: 4.8 10*3/uL (ref 1.7–7.7)
Neutrophils Relative %: 60 %
Platelets: 223 10*3/uL (ref 150–400)
WBC: 7.9 10*3/uL (ref 4.0–10.5)
nRBC: 0 % (ref 0.0–0.2)

## 2020-10-19 LAB — HEPATIC FUNCTION PANEL
ALT: 37 U/L (ref 0–44)
AST: 34 U/L (ref 15–41)
Albumin: 5 g/dL (ref 3.5–5.0)
Alkaline Phosphatase: 68 U/L (ref 38–126)
Bilirubin, Direct: 0.2 mg/dL (ref 0.0–0.2)
Indirect Bilirubin: 0.9 mg/dL (ref 0.3–0.9)
Total Bilirubin: 1.1 mg/dL (ref 0.3–1.2)
Total Protein: 8.3 g/dL — ABNORMAL HIGH (ref 6.5–8.1)

## 2020-10-19 LAB — BASIC METABOLIC PANEL
Anion gap: 16 — ABNORMAL HIGH (ref 5–15)
BUN: 17 mg/dL (ref 6–20)
CO2: 27 mmol/L (ref 22–32)
Calcium: 9.8 mg/dL (ref 8.9–10.3)
Chloride: 81 mmol/L — ABNORMAL LOW (ref 98–111)
Creatinine, Ser: 1.17 mg/dL (ref 0.61–1.24)
GFR, Estimated: >60 mL/min (ref >60)
Glucose, Bld: 145 mg/dL — ABNORMAL HIGH (ref 70–99)
Potassium: 2.8 mmol/L — ABNORMAL LOW (ref 3.5–5.1)
Sodium: 124 mmol/L — ABNORMAL LOW (ref 135–145)

## 2020-10-19 LAB — MAGNESIUM: Magnesium: 2.3 mg/dL (ref 1.7–2.4)

## 2020-10-19 LAB — ETHANOL: Alcohol, Ethyl (B): <10 mg/dL (ref <10)

## 2020-10-19 LAB — RESPIRATORY PANEL BY RT PCR (FLU A&B, COVID)
Influenza A by PCR: NEGATIVE
Influenza B by PCR: NEGATIVE
SARS Coronavirus 2 by RT PCR: NEGATIVE

## 2020-10-19 LAB — OSMOLALITY: Osmolality: 265 mOsm/kg — ABNORMAL LOW (ref 275–295)

## 2020-10-19 MED ORDER — THIAMINE HCL 100 MG PO TABS
100.0000 mg | ORAL_TABLET | Freq: Every day | ORAL | Status: DC
  Administered 2020-10-20 – 2020-10-21 (×3): 100 mg via ORAL
  Filled 2020-10-19 (×3): qty 1

## 2020-10-19 MED ORDER — LORAZEPAM 2 MG PO TABS: 0.0000 mg | ORAL_TABLET | Freq: Two times a day (BID) | ORAL | Status: DC

## 2020-10-19 MED ORDER — IOHEXOL 350 MG/ML SOLN
75.0000 mL | Freq: Once | INTRAVENOUS | Status: AC | PRN
  Administered 2020-10-19: 75 mL via INTRAVENOUS

## 2020-10-19 MED ORDER — THIAMINE HCL 100 MG/ML IJ SOLN
100.0000 mg | Freq: Every day | INTRAMUSCULAR | Status: DC
  Filled 2020-10-19: qty 2

## 2020-10-19 MED ORDER — LORAZEPAM 2 MG PO TABS
0.0000 mg | ORAL_TABLET | Freq: Four times a day (QID) | ORAL | Status: DC
  Administered 2020-10-20: 2 mg via ORAL
  Filled 2020-10-19: qty 1

## 2020-10-19 MED ORDER — LACTATED RINGERS IV BOLUS
1000.0000 mL | Freq: Once | INTRAVENOUS | Status: AC
Start: 2020-10-19 — End: 2020-10-19
  Administered 2020-10-19: 1000 mL via INTRAVENOUS

## 2020-10-19 MED ORDER — ACETAMINOPHEN 500 MG PO TABS
1000.0000 mg | ORAL_TABLET | Freq: Once | ORAL | Status: AC
  Administered 2020-10-19: 1000 mg via ORAL
  Filled 2020-10-19: qty 2

## 2020-10-19 MED ORDER — LORAZEPAM 2 MG/ML IJ SOLN: 0.0000 mg | Freq: Two times a day (BID) | INTRAMUSCULAR | Status: DC

## 2020-10-19 MED ORDER — POTASSIUM CHLORIDE CRYS ER 20 MEQ PO TBCR
80.0000 meq | EXTENDED_RELEASE_TABLET | Freq: Once | ORAL | Status: AC
  Administered 2020-10-19: 80 meq via ORAL
  Filled 2020-10-19: qty 4

## 2020-10-19 MED ORDER — LORAZEPAM 2 MG/ML IJ SOLN: 0.0000 mg | Freq: Four times a day (QID) | INTRAMUSCULAR | Status: DC

## 2020-10-19 NOTE — ED Provider Notes (Signed)
[facility] Emergency Department Provider Note  ____________________________________________   First MD Initiated Contact with Patient 10/19/20 1935     (approximate)  I have reviewed the triage vital signs and the nursing notes.   HISTORY  Chief Complaint Headache   HPI Julian Simmons. is a 54 y.o. male with a past medical history of DM, HTN, and HDL who presents for assessment of headache that began 1 week ago and has since improved but became associated with double vision and decreased movement of the left eye 3 to 4 days ago.  Patient states his headaches are typically in the morning or associate with some nausea and he feels they have gotten slightly better although when it began about 1 week ago he said he felt like the worst migraine he had had in his life.  He states that he did not have any pain in his eye but still has a mild headache over the left eye.  He denies any blurry vision.  Denies any vertigo, neck pain, chest pain, cough, shortness of breath, abdominal pain, back pain, rash, extremity pain, recent traumatic injuries, or acute complaints.  Denies being on any blood thinners.  No recent trauma.  Endorses drinking approximately 5 alcoholic beverages on average per day denies illicit drug use.          Past Medical History:  Diagnosis Date  . Elevated PSA     Patient Active Problem List   Diagnosis Date Noted  . Injury of left elbow 01/15/2016  . DEPRESSIVE DISORDER 09/22/2008  . CIGARETTE SMOKER 05/09/2008  . RECTAL BLEEDING 05/09/2008  . HEMATURIA 05/09/2008  . 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  Prior to Admission medications   Medication Sig Start Date End Date Taking? Authorizing Provider  carvedilol (COREG) 6.25 MG tablet Take 6.25 mg by mouth 2 (two) times daily. 10/05/20   [provider]  hydrochlorothiazide (MICROZIDE) 12.5 MG capsule TAKE 1 CAPSULE BY  MOUTH ONCE DAILY IN THE MORNING 09/08/19   [provider]  lisinopril (ZESTRIL) 5 MG tablet Take 5 mg by mouth daily. 09/08/19   [provider]  lovastatin (MEVACOR) 10 MG tablet TAKE 1 TABLET BY MOUTH ONCE DAILY WITH EVENING MEAL 08/09/19   [provider]  PARoxetine (PAXIL) 30 MG tablet Take 30 mg by mouth daily. 09/08/19   [provider]  sildenafil (VIAGRA) 100 MG tablet Take 100 mg by mouth as needed. 08/09/19   [provider]  tamsulosin (FLOMAX) 0.4 MG CAPS capsule Take 1 capsule (0.4 mg total) by mouth daily. 10/18/19   Stoioff, Verna Czech, MD  temazepam (RESTORIL) 15 MG capsule TAKE 1 CAPSULE BY MOUTH ONCE DAILY AT BEDTIME AS NEEDED 10/13/19   [provider]    Allergies Tetanus toxoid  No family history on file.  Social History Social History   Tobacco Use  . Smoking status: Current Every Day Smoker    Packs/day: 1.00    Years: 30.00    Pack years: 30.00    Types: Cigarettes  . Smokeless tobacco: Never Used  Vaping Use  . Vaping Use: Never used  Substance Use Topics  . Alcohol use: Yes  . Drug use: Never    Review of Systems  Review of Systems  Constitutional: Negative for chills and fever.  HENT: Negative for sore throat.   Eyes: Positive for double vision. Negative for pain.  Respiratory: Negative for  cough and stridor.   Cardiovascular: Negative for chest pain.  Gastrointestinal: Negative for vomiting.  Skin: Negative for rash.  Neurological: Positive for headaches. Negative for seizures and loss of consciousness.  Psychiatric/Behavioral: Negative for suicidal ideas.  All other systems reviewed and are negative.     ____________________________________________   PHYSICAL EXAM:  VITAL SIGNS: ED Triage Vitals  Enc Vitals Group     BP 10/19/20 1923 (!) 141/103     Pulse Rate 10/19/20 1923 94     Resp 10/19/20 1923 18     Temp 10/19/20 1923 98.3 F (36.8 C)     Temp Source 10/19/20 1923 Oral      SpO2 10/19/20 1923 100 %     Weight 10/19/20 1924 185 lb (83.9 kg)     Height 10/19/20 1924 6\' 1"  (1.854 m)     Head Circumference --      Peak Flow --      Pain Score 10/19/20 1923 0     Pain Loc --      Pain Edu? --      Excl. in GC? --    Vitals:   10/19/20 1923 10/19/20 2100  BP: (!) 141/103 (!) 148/102  Pulse: 94 83  Resp: 18 18  Temp: 98.3 F (36.8 C)   SpO2: 100% 97%   Physical Exam Vitals and nursing note reviewed.  Constitutional:      Appearance: He is well-developed.  HENT:     Head: Normocephalic and atraumatic.     Right Ear: External ear normal.     Left Ear: External ear normal.     Nose: Nose normal.  Eyes:     Conjunctiva/sclera: Conjunctivae normal.  Cardiovascular:     Rate and Rhythm: Normal rate and regular rhythm.     Heart sounds: No murmur heard.   Pulmonary:     Effort: Pulmonary effort is normal. No respiratory distress.     Breath sounds: Normal breath sounds.  Abdominal:     Palpations: Abdomen is soft.     Tenderness: There is no abdominal tenderness.  Musculoskeletal:     Cervical back: Neck supple.  Skin:    General: Skin is warm and dry.     Capillary Refill: Capillary refill takes less than 2 seconds.  Neurological:     Mental Status: He is alert and oriented to person, place, and time.  Psychiatric:        Mood and Affect: Mood normal.     No pronator drift.  No finger dysmetria.  Patient has full and symmetric strength on his bilateral upper and lower extremities.  Sensation is intact to light touch throughout his bilateral upper and lower extremities.  The exception of cranial nerve III with the left eye slightly deviated and demonstrating weakness on a duction as well as evidence of some ptosis no other clear cranial nerve deficits.  Endorses double vision that appears binocular as he denies any double vision on monocular vision testing. ____________________________________________   LABS (all labs ordered are listed, but  only abnormal results are displayed)  Labs Reviewed  CBC WITH DIFFERENTIAL/PLATELET - Abnormal; Notable for the following components:      Result Value   Hemoglobin 18.0 (*)    All other components within normal limits  BASIC METABOLIC PANEL - Abnormal; Notable for the following components:   Sodium 124 (*)    Potassium 2.8 (*)    Chloride 81 (*)    Glucose, Bld 145 (*)  Anion gap 16 (*)    All other components within normal limits  RESPIRATORY PANEL BY RT PCR (FLU A&B, COVID)  MAGNESIUM  HEPATIC FUNCTION PANEL  URINALYSIS, COMPLETE (UACMP) WITH MICROSCOPIC  ETHANOL  OSMOLALITY  URINE DRUG SCREEN, QUALITATIVE ([facility] ONLY)   ____________________________________________  EKG  Sinus rhythm with a ventricular rate of 85, left axis deviation, artifact in lead II and no other clear evidence of acute ischemia or other significant underlying arrhythmia.  Unremarkable intervals. ____________________________________________  RADIOLOGY  ED MD interpretation: No evidence of acute intracranial hemorrhage or CVA.  Possible right sided aneurysm as noted below.  Official radiology report(s): CT Angio Head W or Wo Contrast  Result Date: 10/19/2020 CLINICAL DATA:  Initial evaluation for headache, double vision. EXAM: CT ANGIOGRAPHY HEAD AND NECK TECHNIQUE: Multidetector CT imaging of the head and neck was performed using the standard protocol during bolus administration of intravenous contrast. Multiplanar CT image reconstructions and MIPs were obtained to evaluate the vascular anatomy. Carotid stenosis measurements (when applicable) are obtained utilizing NASCET criteria, using the distal internal carotid diameter as the denominator. CONTRAST:  75mL OMNIPAQUE IOHEXOL 350 MG/ML SOLN COMPARISON:  None available. FINDINGS: CT HEAD FINDINGS Brain: Cerebral volume within normal limits for patient age. No evidence for acute intracranial hemorrhage. No findings to suggest acute large vessel territory  infarct. No mass lesion, midline shift, or mass effect. Ventricles are normal in size without evidence for hydrocephalus. No extra-axial fluid collection identified. Vascular: No hyperdense vessel identified. Skull: Scalp soft tissues demonstrate no acute abnormality. Calvarium intact. Sinuses/Orbits: Globes and orbital soft tissues within normal limits. Visualized paranasal sinuses are clear. No mastoid effusion. CTA NECK FINDINGS Aortic arch: Visualized aortic arch of normal caliber with normal 3 vessel morphology. Mild atheromatous change at the origin of the left subclavian artery without significant stenosis. No hemodynamically significant stenosis about the origin of the great vessels. Right carotid system: Right common and internal carotid arteries widely patent without stenosis, dissection or occlusion. Mild atheromatous irregularity about the right bifurcation without significant stenosis. Left carotid system: Left common and internal carotid arteries widely patent without stenosis, dissection, or occlusion. Mild atheromatous irregularity about the left bifurcation without significant stenosis. Vertebral arteries: Both vertebral arteries arise from the subclavian arteries. No proximal subclavian artery stenosis. Atheromatous plaque at the origin of the left vertebral artery with associated stenosis of up to 40%. Left vertebral artery dominant, with a diffusely hypoplastic right vertebral artery. Vertebral arteries otherwise patent within the neck without stenosis, dissection or occlusion. Skeleton: No acute visible acute osseous abnormality. No discrete or worrisome osseous lesions. Moderate multilevel cervical spondylosis without high-grade spinal stenosis. Other neck: No other acute soft tissue abnormality within the neck. No mass lesion or adenopathy. Upper chest: Visualized upper chest demonstrates no acute finding. Mild upper lobe paraseptal emphysematous changes. Review of the MIP images confirms the  above findings CTA HEAD FINDINGS Anterior circulation: Petrous segments patent bilaterally. Cavernous and supraclinoid ICAs widely patent without stenosis. Subtle 2 mm focal outpouching extending inferiorly from the cavernous right ICA could reflect a small vascular infundibulum versus aneurysm (series 515, image 88). ICA termini well perfused. A1 segments patent bilaterally. Normal anterior communicating artery complex. Anterior cerebral arteries widely patent to their distal aspects. No M1 stenosis or occlusion. Normal MCA bifurcations. Distal MCA branches well perfused and symmetric. Posterior circulation: Dominant left vertebral artery widely patent to the vertebrobasilar junction. Patent left PICA. Hypoplastic right vertebral artery terminates in PICA. Right PICA patent as well. Basilar widely  patent to its distal aspect. Superior cerebral arteries patent bilaterally. Both PCAs primarily supplied via the basilar well perfused to their distal aspects. Venous sinuses: Grossly patent allowing for timing the contrast bolus. Anatomic variants: Hypoplastic right vertebral artery terminates in PICA. Review of the MIP images confirms the above findings IMPRESSION: CT HEAD IMPRESSION: Normal head CT for age. No acute intracranial abnormality identified. CTA HEAD AND NECK IMPRESSION: 1. Negative CTA for large vessel occlusion or other acute vascular abnormality. 2. 2 mm focal outpouching extending inferiorly from the cavernous right ICA, which could reflect a small vascular infundibulum versus aneurysm. 3. 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 Left vertebral artery dominant. Right vertebral artery diffusely hypoplastic and terminates in PICA. 4. Emphysema (ICD10-J43.9). Electronically Signed   By: Rise MuBenjamin  McClintock M.D.   On: 10/19/2020 20:58   CT Angio Neck W and/or Wo Contrast  Result Date: 10/19/2020 CLINICAL DATA:  Initial evaluation for headache, double vision. EXAM: CT  ANGIOGRAPHY HEAD AND NECK TECHNIQUE: Multidetector CT imaging of the head and neck was performed using the standard protocol during bolus administration of intravenous contrast. Multiplanar CT image reconstructions and MIPs were obtained to evaluate the vascular anatomy. Carotid stenosis measurements (when applicable) are obtained utilizing NASCET criteria, using the distal internal carotid diameter as the denominator. CONTRAST:  75mL OMNIPAQUE IOHEXOL 350 MG/ML SOLN COMPARISON:  None available. FINDINGS: CT HEAD FINDINGS Brain: Cerebral volume within normal limits for patient age. No evidence for acute intracranial hemorrhage. No findings to suggest acute large vessel territory infarct. No mass lesion, midline shift, or mass effect. Ventricles are normal in size without evidence for hydrocephalus. No extra-axial fluid collection identified. Vascular: No hyperdense vessel identified. Skull: Scalp soft tissues demonstrate no acute abnormality. Calvarium intact. Sinuses/Orbits: Globes and orbital soft tissues within normal limits. Visualized paranasal sinuses are clear. No mastoid effusion. CTA NECK FINDINGS Aortic arch: Visualized aortic arch of normal caliber with normal 3 vessel morphology. Mild atheromatous change at the origin of the left subclavian artery without significant stenosis. No hemodynamically significant stenosis about the origin of the great vessels. Right carotid system: Right common and internal carotid arteries widely patent without stenosis, dissection or occlusion. Mild atheromatous irregularity about the right bifurcation without significant stenosis. Left carotid system: Left common and internal carotid arteries widely patent without stenosis, dissection, or occlusion. Mild atheromatous irregularity about the left bifurcation without significant stenosis. Vertebral arteries: Both vertebral arteries arise from the subclavian arteries. No proximal subclavian artery stenosis. Atheromatous plaque  at the origin of the left vertebral artery with associated stenosis of up to 40%. Left vertebral artery dominant, with a diffusely hypoplastic right vertebral artery. Vertebral arteries otherwise patent within the neck without stenosis, dissection or occlusion. Skeleton: No acute visible acute osseous abnormality. No discrete or worrisome osseous lesions. Moderate multilevel cervical spondylosis without high-grade spinal stenosis. Other neck: No other acute soft tissue abnormality within the neck. No mass lesion or adenopathy. Upper chest: Visualized upper chest demonstrates no acute finding. Mild upper lobe paraseptal emphysematous changes. Review of the MIP images confirms the above findings CTA HEAD FINDINGS Anterior circulation: Petrous segments patent bilaterally. Cavernous and supraclinoid ICAs widely patent without stenosis. Subtle 2 mm focal outpouching extending inferiorly from the cavernous right ICA could reflect a small vascular infundibulum versus aneurysm (series 515, image 88). ICA termini well perfused. A1 segments patent bilaterally. Normal anterior communicating artery complex. Anterior cerebral arteries widely patent to their distal aspects. No M1 stenosis or occlusion. Normal  MCA bifurcations. Distal MCA branches well perfused and symmetric. Posterior circulation: Dominant left vertebral artery widely patent to the vertebrobasilar junction. Patent left PICA. Hypoplastic right vertebral artery terminates in PICA. Right PICA patent as well. Basilar widely patent to its distal aspect. Superior cerebral arteries patent bilaterally. Both PCAs primarily supplied via the basilar well perfused to their distal aspects. Venous sinuses: Grossly patent allowing for timing the contrast bolus. Anatomic variants: Hypoplastic right vertebral artery terminates in PICA. Review of the MIP images confirms the above findings IMPRESSION: CT HEAD IMPRESSION: Normal head CT for age. No acute intracranial abnormality  identified. CTA HEAD AND NECK IMPRESSION: 1. Negative CTA for large vessel occlusion or other acute vascular abnormality. 2. 2 mm focal outpouching extending inferiorly from the cavernous right ICA, which could reflect a small vascular infundibulum versus aneurysm. 3. 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 Left vertebral artery dominant. Right vertebral artery diffusely hypoplastic and terminates in PICA. 4. Emphysema (ICD10-J43.9). Electronically Signed   By: Rise Mu M.D.   On: 10/19/2020 20:58    ____________________________________________   PROCEDURES  Procedure(s) performed (including Critical Care):  .1-3 Lead EKG Interpretation Performed by: Gilles Chiquito, MD Authorized by: Gilles Chiquito, MD     Interpretation: normal     ECG rate assessment: normal     Rhythm: sinus rhythm     Ectopy: none     Conduction: normal       ____________________________________________   INITIAL IMPRESSION / ASSESSMENT AND PLAN / ED COURSE        Patient presents with left history exam for assessment of excruciating headache that began 1 week ago and slowly improved but then became associated with double vision and drooping of the left eye.  Patient is noted to be slightly hypertensive with a BP of 141/103 with otherwise stable vital signs on room air on arrival.  Differential includes but is not limited to cranial nerve III palsy secondary to aneurysm, intracranial hemorrhage, CVA, metabolic derangements, diabetes, and toxic ingestion.  No history of any cycloplegics left eye.  CBC with some artifact apparently but with unremarkable WBC count and platelets and slightly elevated hemoglobin 18.  CMP remarkable for hyponatremia with NA of 124 compared to 133 1 year ago as well as evidence of hyperkalemia with a K of 2.8, hypochloremia with a chloride of 81 with anion gap of 16.  Patient is also slightly hyperglycemic with a glucose of 145 with no  other significant derangements.  CTA head and neck obtained shows no evidence of aneurysm that would localize to patient's area of palsy, intracranial hemorrhage, or evidence of CVA.  He is noted to have a possible incidental aneurysm that is 2 mm from the cavernous right ICA versus a vascular infundibulum.  There were no other large vessel occlusions or other vascular abnormalities noted.  He does have 40% stenosis at the origin of the left vertebral artery and there is some emphysema visible at the apex of the lungs.  Unclear etiology for patient's 3rd nerve palsy although I do believe this will require further work-up with likely MRI and nonemergent neurology consult tomorrow morning.  In addition patient's degree of hyponatremia warrants admission for further work-up.  I will plan to admit to medicine service for further evaluation and management.  Unclear if hyponatremia is contributing or related to patient's dental palsy at this time.  ____________________________________________   FINAL CLINICAL IMPRESSION(S) / ED DIAGNOSES  Final diagnoses:  Left  oculomotor nerve palsy  Hyponatremia  Hypokalemia  Hypochloremia  Hypertension, unspecified type  ETOH abuse    Medications  LORazepam (ATIVAN) injection 0-4 mg (has no administration in time range)    Or  LORazepam (ATIVAN) tablet 0-4 mg (has no administration in time range)  LORazepam (ATIVAN) injection 0-4 mg (has no administration in time range)    Or  LORazepam (ATIVAN) tablet 0-4 mg (has no administration in time range)  thiamine tablet 100 mg (has no administration in time range)    Or  thiamine (B-1) injection 100 mg (has no administration in time range)  acetaminophen (TYLENOL) tablet 1,000 mg (1,000 mg Oral Given 10/19/20 2052)  iohexol (OMNIPAQUE) 350 MG/ML injection 75 mL (75 mLs Intravenous Contrast Given 10/19/20 2000)  lactated ringers bolus 1,000 mL (0 mLs Intravenous Stopped 10/19/20 2139)  potassium chloride SA  (KLOR-CON) CR tablet 80 mEq (80 mEq Oral Given 10/19/20 2052)     ED Discharge Orders    None       Note:  This document was prepared using Dragon voice recognition software and may include unintentional dictation errors.   Gilles Chiquito, MD 10/19/20 2140

## 2020-10-19 NOTE — H&P (Signed)
History and Physical   Julian Simmons. ZDG:[id number] DOB: Jun 11, 1966 DOA: 10/19/2020  Referring MD/NP/PA: Dr. Antoine Primas  PCP: Aida Puffer, MD   Outpatient Specialists: None  Patient coming from: Home  Chief Complaint: Persistent headaches  HPI: Branch Pacitti. is a 54 y.o. male with medical history significant of tobacco and alcohol abuse, recent injury with chronic headaches who apparently has been having persistent headache mainly on the left side.  In the last 2 to 3 weeks he has noticed his left eye is not closing.  His headache got worse a week ago where he felt like there was ever headache.  His Previous migraine headaches but this is different.  This has progressed to have pain in his left eye.  No blurry vision prior to now.  But now he is having double vision.  No chest pain no focal weakness.  Patient's left eye is currently closed.  He recently lost his diet and has double up on his alcohol intake.  He is drinking at least 5 alcoholic beverages every day.  Patient is depressed from that and still mourning his dad.  In the ER patient was noted to have left-sided ptosis but no evidence of Bell's palsy.  Work-up also indicated possible cranial nerve III palsy based on presentation and CT.  He is being admitted to the hospital for further work-up..  ED Course: Temperature 98.3 blood pressure 148/102 pulse 94 respirate of 18 oxygen sat 95% on room air.  His white count was 7.9 hemoglobin 18.0 platelets 223.  Sodium 124 potassium 2.8 chloride 81 CO2 27 BUN 17 creatinine 1.17 and calcium 9.9 glucose 145.  Magnesium is 2.3.  Urinalysis mostly negative.  Viral screen is negative including COVID-19 alcohol level less than 10.  Head CT without contrast and CT angiogram of the head and neck shows no large vessel occlusion.  There is a 2 mm focal cavernous right ICA lesion possibly an aneurysm.  Also 40% other masses stenosis at the origin of the left vertebral artery and some emphysema.   Head CT showed no other acute findings.  Patient being admitted with possible cranial nerve III palsy probably due to aneurysm  Review of Systems: As per HPI otherwise 10 point review of systems negative.    Past Medical History:  Diagnosis Date  . Elevated PSA     No past surgical history on file.   reports that he has been smoking cigarettes. He has a 30.00 pack-year smoking history. He has never used smokeless tobacco. He reports current alcohol use. He reports that he does not use drugs.  Allergies  Allergen Reactions  . Tetanus Toxoid     No family history on file.   Prior to Admission medications   Medication Sig Start Date End Date Taking? Authorizing Provider  carvedilol (COREG) 6.25 MG tablet Take 6.25 mg by mouth 2 (two) times daily. 10/05/20  Yes [provider]  hydrochlorothiazide (MICROZIDE) 12.5 MG capsule TAKE 1 CAPSULE BY MOUTH ONCE DAILY IN THE MORNING 09/08/19  Yes [provider]  lovastatin (MEVACOR) 10 MG tablet TAKE 1 TABLET BY MOUTH ONCE DAILY WITH EVENING MEAL 08/09/19  Yes [provider]  PARoxetine (PAXIL) 30 MG tablet Take 30 mg by mouth daily. 09/08/19  Yes [provider]  temazepam (RESTORIL) 15 MG capsule TAKE 1 CAPSULE BY MOUTH ONCE DAILY AT BEDTIME AS NEEDED 10/13/19  Yes [provider]  lisinopril (ZESTRIL) 5 MG tablet Take 5 mg by  mouth daily. Patient not taking: Reported on 10/19/2020 09/08/19   [provider]  sildenafil (VIAGRA) 100 MG tablet Take 100 mg by mouth as needed. Patient not taking: Reported on 10/19/2020 08/09/19   [provider]  tamsulosin (FLOMAX) 0.4 MG CAPS capsule Take 1 capsule (0.4 mg total) by mouth daily. Patient not taking: Reported on 10/19/2020 10/18/19   Riki Altes, MD    Physical Exam: Vitals:   10/19/20 1923 10/19/20 1924 10/19/20 2100 10/19/20 2130  BP: (!) 141/103  (!) 148/102 132/87  Pulse: 94  83 73  Resp: Temp: 98.3 F (36.8 C)      TempSrc: Oral     SpO2: 100%  97% 95%  Weight:  83.9 kg    Height:   (1.854 m)        Constitutional: Acutely ill looking, tremulous Vitals:   10/19/20 1923 10/19/20 1924 10/19/20 2100 10/19/20 2130  BP: (!) 141/103  (!) 148/102 132/87  Pulse: 94  83 73  Resp: Temp: 98.3 F (36.8 C)     TempSrc: Oral     SpO2: 100%  97% 95%  Weight:  83.9 kg    Height:   (1.854 m)     Eyes: PERRL, left eye ptosis and conjunctivae injected on the left ENMT: Mucous membranes are moist. Posterior pharynx clear of any exudate or lesions.Normal dentition.  Neck: normal, supple, no masses, no thyromegaly Respiratory: clear to auscultation bilaterally, no wheezing, no crackles. Normal respiratory effort. No accessory muscle use.  Cardiovascular: Regular rate and rhythm, no murmurs / rubs / gallops. No extremity edema. 2+ pedal pulses. No carotid bruits.  Abdomen: no tenderness, no masses palpated. No hepatosplenomegaly. Bowel sounds positive.  Musculoskeletal: no clubbing / cyanosis. No joint deformity upper and lower extremities. Good ROM, no contractures. Normal muscle tone.  Skin: no rashes, lesions, ulcers. No induration Neurologic: CN 2-12 grossly intact. Sensation intact, DTR normal. Strength 5/5 in all 4.  No focal neurologic deficit, mild pulsing of the left eye with disconjugate vision Psychiatric: Normal judgment and insight. Alert and oriented x 3.  Anxious mood.     Labs on Admission: I have personally reviewed following labs and imaging studies  CBC: Recent Labs  Lab 10/19/20 1935  WBC 7.9  NEUTROABS 4.8  HGB 18.0*  HCT RESULTS UNAVAILABLE DUE TO INTERFERING SUBSTANCE  MCV RESULTS UNAVAILABLE DUE TO INTERFERING SUBSTANCE  PLT 223   Basic Metabolic Panel: Recent Labs  Lab 10/19/20 1935 10/19/20 2130  NA 124*  --   K 2.8*  --   CL 81*  --   CO2 27  --   GLUCOSE 145*  --   BUN 17  --   CREATININE 1.17  --   CALCIUM 9.8  --   MG  --  2.3    GFR: Estimated Creatinine Clearance: 83.5 mL/min (by C-G formula based on SCr of 1.17 mg/dL). Liver Function Tests: Recent Labs  Lab 10/19/20 2130  AST 34  ALT 37  ALKPHOS 68  BILITOT 1.1  PROT 8.3*  ALBUMIN 5.0   No results for input(s): LIPASE, AMYLASE in the last 168 hours. No results for input(s): AMMONIA in the last 168 hours. Coagulation Profile: No results for input(s): INR, PROTIME in the last 168 hours. Cardiac Enzymes: No results for input(s): CKTOTAL, CKMB, CKMBINDEX, TROPONINI in the last 168 hours. BNP (last 3 results) No results for input(s): PROBNP in the last  8760 hours. HbA1C: No results for input(s): HGBA1C in the last 72 hours. CBG: No results for input(s): GLUCAP in the last 168 hours. Lipid Profile: No results for input(s): CHOL, HDL, LDLCALC, TRIG, CHOLHDL, LDLDIRECT in the last 72 hours. Thyroid Function Tests: No results for input(s): TSH, T4TOTAL, FREET4, T3FREE, THYROIDAB in the last 72 hours. Anemia Panel: No results for input(s): VITAMINB12, FOLATE, FERRITIN, TIBC, IRON, RETICCTPCT in the last 72 hours. Urine analysis: No results found for: COLORURINE, APPEARANCEUR, LABSPEC, PHURINE, GLUCOSEU, HGBUR, BILIRUBINUR, KETONESUR, PROTEINUR, UROBILINOGEN, NITRITE, LEUKOCYTESUR Sepsis Labs: @LABRCNTIP (procalcitonin:4,lacticidven:4) ) Recent Results (from the past 240 hour(s))  Respiratory Panel by RT PCR (Flu A&B, Covid) - Nasopharyngeal Swab     Status: None   Collection Time: 10/19/20  8:58 PM   Specimen: Nasopharyngeal Swab  Result Value Ref Range Status   SARS Coronavirus 2 by RT PCR NEGATIVE NEGATIVE Final    Comment: (NOTE) SARS-CoV-2 target nucleic acids are NOT DETECTED.  The SARS-CoV-2 RNA is generally detectable in upper respiratoy specimens during the acute phase of infection. The lowest concentration of SARS-CoV-2 viral copies this assay can detect is 131 copies/mL. A negative result does not preclude SARS-Cov-2 infection and should  not be used as the sole basis for treatment or other patient management decisions. A negative result may occur with  improper specimen collection/handling, submission of specimen other than nasopharyngeal swab, presence of viral mutation(s) within the areas targeted by this assay, and inadequate number of viral copies (<131 copies/mL). A negative result must be combined with clinical observations, patient history, and epidemiological information. The expected result is Negative.  Fact Sheet for Patients:  10/21/20  Fact Sheet for Healthcare Providers:  https://www.moore.com/  This test is no t yet approved or cleared by the https://www.young.biz/ FDA and  has been authorized for detection and/or diagnosis of SARS-CoV-2 by FDA under an Emergency Use Authorization (EUA). This EUA will remain  in effect (meaning this test can be used) for the duration of the COVID-19 declaration under Section 564(b)(1) of the Act, 21 U.S.C. section 360bbb-3(b)(1), unless the authorization is terminated or revoked sooner.     Influenza A by PCR NEGATIVE NEGATIVE Final   Influenza B by PCR NEGATIVE NEGATIVE Final    Comment: (NOTE) The Xpert Xpress SARS-CoV-2/FLU/RSV assay is intended as an aid in  the diagnosis of influenza from Nasopharyngeal swab specimens and  should not be used as a sole basis for treatment. Nasal washings and  aspirates are unacceptable for Xpert Xpress SARS-CoV-2/FLU/RSV  testing.  Fact Sheet for Patients: [place]  Fact Sheet for Healthcare Providers: https://www.moore.com/  This test is not yet approved or cleared by the https://www.young.biz/ FDA and  has been authorized for detection and/or diagnosis of SARS-CoV-2 by  FDA under an Emergency Use Authorization (EUA). This EUA will remain  in effect (meaning this test can be used) for the duration of the  Covid-19 declaration under  Section 564(b)(1) of the Act, 21  U.S.C. section 360bbb-3(b)(1), unless the authorization is  terminated or revoked. Performed at [facility], [address] [place], [place]      Radiological Exams on Admission: CT Angio Head W or Wo Contrast  Result Date: 10/19/2020 CLINICAL DATA:  Initial evaluation for headache, double vision. EXAM: CT ANGIOGRAPHY HEAD AND NECK TECHNIQUE: Multidetector CT imaging of the head and neck was performed using the standard protocol during bolus administration of intravenous contrast. Multiplanar CT image reconstructions and MIPs were obtained to evaluate the vascular  anatomy. Carotid stenosis measurements (when applicable) are obtained utilizing NASCET criteria, using the distal internal carotid diameter as the denominator. CONTRAST:  49mL OMNIPAQUE IOHEXOL 350 MG/ML SOLN COMPARISON:  None available. FINDINGS: CT HEAD FINDINGS Brain: Cerebral volume within normal limits for patient age. No evidence for acute intracranial hemorrhage. No findings to suggest acute large vessel territory infarct. No mass lesion, midline shift, or mass effect. Ventricles are normal in size without evidence for hydrocephalus. No extra-axial fluid collection identified. Vascular: No hyperdense vessel identified. Skull: Scalp soft tissues demonstrate no acute abnormality. Calvarium intact. Sinuses/Orbits: Globes and orbital soft tissues within normal limits. Visualized paranasal sinuses are clear. No mastoid effusion. CTA NECK FINDINGS Aortic arch: Visualized aortic arch of normal caliber with normal 3 vessel morphology. Mild atheromatous change at the origin of the left subclavian artery without significant stenosis. No hemodynamically significant stenosis about the origin of the great vessels. Right carotid system: Right common and internal carotid arteries widely patent without stenosis, dissection or occlusion. Mild atheromatous irregularity about the right bifurcation  without significant stenosis. Left carotid system: Left common and internal carotid arteries widely patent without stenosis, dissection, or occlusion. Mild atheromatous irregularity about the left bifurcation without significant stenosis. Vertebral arteries: Both vertebral arteries arise from the subclavian arteries. No proximal subclavian artery stenosis. Atheromatous plaque at the origin of the left vertebral artery with associated stenosis of up to 40%. Left vertebral artery dominant, with a diffusely hypoplastic right vertebral artery. Vertebral arteries otherwise patent within the neck without stenosis, dissection or occlusion. Skeleton: No acute visible acute osseous abnormality. No discrete or worrisome osseous lesions. Moderate multilevel cervical spondylosis without high-grade spinal stenosis. Other neck: No other acute soft tissue abnormality within the neck. No mass lesion or adenopathy. Upper chest: Visualized upper chest demonstrates no acute finding. Mild upper lobe paraseptal emphysematous changes. Review of the MIP images confirms the above findings CTA HEAD FINDINGS Anterior circulation: Petrous segments patent bilaterally. Cavernous and supraclinoid ICAs widely patent without stenosis. Subtle 2 mm focal outpouching extending inferiorly from the cavernous right ICA could reflect a small vascular infundibulum versus aneurysm (series 515, image 88). ICA termini well perfused. A1 segments patent bilaterally. Normal anterior communicating artery complex. Anterior cerebral arteries widely patent to their distal aspects. No M1 stenosis or occlusion. Normal MCA bifurcations. Distal MCA branches well perfused and symmetric. Posterior circulation: Dominant left vertebral artery widely patent to the vertebrobasilar junction. Patent left PICA. Hypoplastic right vertebral artery terminates in PICA. Right PICA patent as well. Basilar widely patent to its distal aspect. Superior cerebral arteries patent  bilaterally. Both PCAs primarily supplied via the basilar well perfused to their distal aspects. Venous sinuses: Grossly patent allowing for timing the contrast bolus. Anatomic variants: Hypoplastic right vertebral artery terminates in PICA. Review of the MIP images confirms the above findings IMPRESSION: CT HEAD IMPRESSION: Normal head CT for age. No acute intracranial abnormality identified. CTA HEAD AND NECK IMPRESSION: 1. Negative CTA for large vessel occlusion or other acute vascular abnormality. 2. 2 mm focal outpouching extending inferiorly from the cavernous right ICA, which could reflect a small vascular infundibulum versus aneurysm. 3. 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 Left vertebral artery dominant. Right vertebral artery diffusely hypoplastic and terminates in PICA. 4. Emphysema (ICD10-J43.9). Electronically Signed   By: Rise Mu M.D.   On: 10/19/2020 20:58   CT Angio Neck W and/or Wo Contrast  Result Date: 10/19/2020 CLINICAL DATA:  Initial evaluation for headache, double vision. EXAM:  CT ANGIOGRAPHY HEAD AND NECK TECHNIQUE: Multidetector CT imaging of the head and neck was performed using the standard protocol during bolus administration of intravenous contrast. Multiplanar CT image reconstructions and MIPs were obtained to evaluate the vascular anatomy. Carotid stenosis measurements (when applicable) are obtained utilizing NASCET criteria, using the distal internal carotid diameter as the denominator. CONTRAST:  75mL OMNIPAQUE IOHEXOL 350 MG/ML SOLN COMPARISON:  None available. FINDINGS: CT HEAD FINDINGS Brain: Cerebral volume within normal limits for patient age. No evidence for acute intracranial hemorrhage. No findings to suggest acute large vessel territory infarct. No mass lesion, midline shift, or mass effect. Ventricles are normal in size without evidence for hydrocephalus. No extra-axial fluid collection identified. Vascular: No hyperdense  vessel identified. Skull: Scalp soft tissues demonstrate no acute abnormality. Calvarium intact. Sinuses/Orbits: Globes and orbital soft tissues within normal limits. Visualized paranasal sinuses are clear. No mastoid effusion. CTA NECK FINDINGS Aortic arch: Visualized aortic arch of normal caliber with normal 3 vessel morphology. Mild atheromatous change at the origin of the left subclavian artery without significant stenosis. No hemodynamically significant stenosis about the origin of the great vessels. Right carotid system: Right common and internal carotid arteries widely patent without stenosis, dissection or occlusion. Mild atheromatous irregularity about the right bifurcation without significant stenosis. Left carotid system: Left common and internal carotid arteries widely patent without stenosis, dissection, or occlusion. Mild atheromatous irregularity about the left bifurcation without significant stenosis. Vertebral arteries: Both vertebral arteries arise from the subclavian arteries. No proximal subclavian artery stenosis. Atheromatous plaque at the origin of the left vertebral artery with associated stenosis of up to 40%. Left vertebral artery dominant, with a diffusely hypoplastic right vertebral artery. Vertebral arteries otherwise patent within the neck without stenosis, dissection or occlusion. Skeleton: No acute visible acute osseous abnormality. No discrete or worrisome osseous lesions. Moderate multilevel cervical spondylosis without high-grade spinal stenosis. Other neck: No other acute soft tissue abnormality within the neck. No mass lesion or adenopathy. Upper chest: Visualized upper chest demonstrates no acute finding. Mild upper lobe paraseptal emphysematous changes. Review of the MIP images confirms the above findings CTA HEAD FINDINGS Anterior circulation: Petrous segments patent bilaterally. Cavernous and supraclinoid ICAs widely patent without stenosis. Subtle 2 mm focal outpouching  extending inferiorly from the cavernous right ICA could reflect a small vascular infundibulum versus aneurysm (series 515, image 88). ICA termini well perfused. A1 segments patent bilaterally. Normal anterior communicating artery complex. Anterior cerebral arteries widely patent to their distal aspects. No M1 stenosis or occlusion. Normal MCA bifurcations. Distal MCA branches well perfused and symmetric. Posterior circulation: Dominant left vertebral artery widely patent to the vertebrobasilar junction. Patent left PICA. Hypoplastic right vertebral artery terminates in PICA. Right PICA patent as well. Basilar widely patent to its distal aspect. Superior cerebral arteries patent bilaterally. Both PCAs primarily supplied via the basilar well perfused to their distal aspects. Venous sinuses: Grossly patent allowing for timing the contrast bolus. Anatomic variants: Hypoplastic right vertebral artery terminates in PICA. Review of the MIP images confirms the above findings IMPRESSION: CT HEAD IMPRESSION: Normal head CT for age. No acute intracranial abnormality identified. CTA HEAD AND NECK IMPRESSION: 1. Negative CTA for large vessel occlusion or other acute vascular abnormality. 2. 2 mm focal outpouching extending inferiorly from the cavernous right ICA, which could reflect a small vascular infundibulum versus aneurysm. 3. 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 Left vertebral artery dominant. Right vertebral artery diffusely hypoplastic and terminates in PICA. 4. Emphysema (  ICD10-J43.9). Electronically Signed   By: Rise Mu M.D.   On: 10/19/2020 20:58    EKG: Independently reviewed.  Shows sinus rhythm with no significant ST changes.  Assessment/Plan Principal Problem:   Bilateral headaches Active Problems:   CIGARETTE SMOKER   Alcohol abuse   Cranial nerve III palsy   Chronic headaches   Hyponatremia   Hypokalemia     #1 bilateral headaches: Acute on chronic.   Probably related to CVA versus cranial nerve palsy versus worsening migraines.  Patient will be admitted.  Initiate home regimen and get neurology consult.  #2 left eye ptosis: Suspected cranial nerve III palsy.  Again get neurology to help with evaluation.  MRI of the brain, CTA results noted.  #3 alcohol abuse: CIWA protocol initiated.  #4 tobacco abuse: Counseling with nicotine patch offered.  #5 hyponatremia: Most likely as result of alcoholism.  Hydrate gently.  Monitor closely sodium level.  #6 hypokalemia: Continue to replete potassium.  #7 severe depression: Secondary to losing his diet recently.  Monitor closely.   DVT prophylaxis: Lovenox Code Status: Full code Family Communication: Wife at bedside Disposition Plan: To be determined Consults called: Consult neurology in the morning Admission status: Inpatient  Severity of Illness: The appropriate patient status for this patient is INPATIENT. Inpatient status is judged to be reasonable and necessary in order to provide the required intensity of service to ensure the patient's safety. The patient's presenting symptoms, physical exam findings, and initial radiographic and laboratory data in the context of their chronic comorbidities is felt to place them at high risk for further clinical deterioration. Furthermore, it is not anticipated that the patient will be medically stable for discharge from the hospital within 2 midnights of admission. The following factors support the patient status of inpatient.   " The patient's presenting symptoms include headache. " The worrisome physical exam findings include right eye ptosis. " The initial radiographic and laboratory data are worrisome because of CT findings of possible aneurysm. " The chronic co-morbidities include alcoholism.   * I certify that at the point of admission it is my clinical judgment that the patient will require inpatient hospital care spanning beyond 2 midnights  from the point of admission due to high intensity of service, high risk for further deterioration and high frequency of surveillance required.Lonia Blood MD [facility] Pager [phone]  If 7PM-7AM, please contact night-coverage [email or web address] Password [facility]  10/19/2020, 11:21 PM

## 2020-10-19 NOTE — ED Triage Notes (Signed)
Pt in with co headache for few days and states started seeing double vision yesterday. Left eye noted to be deviated to the left with large pupil. Pt denies any history of the same, hx of injury to left eyebrow years but states but did not affect eye.

## 2020-10-20 ENCOUNTER — Inpatient Hospital Stay: Payer: BC Managed Care – PPO

## 2020-10-20 ENCOUNTER — Encounter: Payer: Self-pay | Admitting: Internal Medicine

## 2020-10-20 ENCOUNTER — Inpatient Hospital Stay
Admit: 2020-10-20 | Discharge: 2020-10-20 | Disposition: A | Discharge status: Home / Self Care | Payer: BC Managed Care – PPO | Attending: Internal Medicine | Admitting: Internal Medicine

## 2020-10-20 DIAGNOSIS — E871 Hypo-osmolality and hyponatremia: Secondary | ICD-10-CM

## 2020-10-20 DIAGNOSIS — E876 Hypokalemia: Secondary | ICD-10-CM

## 2020-10-20 DIAGNOSIS — H4902 Third [oculomotor] nerve palsy, left eye: Secondary | ICD-10-CM

## 2020-10-20 DIAGNOSIS — F1010 Alcohol abuse, uncomplicated: Secondary | ICD-10-CM

## 2020-10-20 LAB — CREATININE, SERUM
Creatinine, Ser: 1.07 mg/dL (ref 0.61–1.24)
GFR, Estimated: >60 mL/min (ref >60)

## 2020-10-20 LAB — URINE DRUG SCREEN, QUALITATIVE (ARMC ONLY)
Amphetamines, Ur Screen: NOT DETECTED
Barbiturates, Ur Screen: NOT DETECTED
Benzodiazepine, Ur Scrn: POSITIVE — AB
Cannabinoid 50 Ng, Ur ~~LOC~~: NOT DETECTED
Cocaine Metabolite,Ur ~~LOC~~: NOT DETECTED
MDMA (Ecstasy)Ur Screen: NOT DETECTED
Methadone Scn, Ur: NOT DETECTED
Opiate, Ur Screen: NOT DETECTED
Phencyclidine (PCP) Ur S: NOT DETECTED
Tricyclic, Ur Screen: NOT DETECTED

## 2020-10-20 LAB — HIV ANTIBODY (ROUTINE TESTING W REFLEX): HIV Screen 4th Generation wRfx: NONREACTIVE

## 2020-10-20 LAB — URINALYSIS, COMPLETE (UACMP) WITH MICROSCOPIC
Bacteria, UA: NONE SEEN
Bilirubin Urine: NEGATIVE
Glucose, UA: NEGATIVE mg/dL
Ketones, ur: NEGATIVE mg/dL
Leukocytes,Ua: NEGATIVE
Nitrite: NEGATIVE
Protein, ur: NEGATIVE mg/dL
Specific Gravity, Urine: 1.021 (ref 1.005–1.030)
Squamous Epithelial / LPF: NONE SEEN (ref 0–5)
pH: 5 (ref 5.0–8.0)

## 2020-10-20 LAB — CBC
HCT: 44.7 % (ref 39.0–52.0)
Hemoglobin: 16.4 g/dL (ref 13.0–17.0)
MCH: 32.3 pg (ref 26.0–34.0)
MCHC: 36.7 g/dL — ABNORMAL HIGH (ref 30.0–36.0)
MCV: 88.2 fL (ref 80.0–100.0)
Platelets: 243 10*3/uL (ref 150–400)
RBC: 5.07 MIL/uL (ref 4.22–5.81)
RDW: 11.8 % (ref 11.5–15.5)
WBC: 8 10*3/uL (ref 4.0–10.5)
nRBC: 0 % (ref 0.0–0.2)

## 2020-10-20 LAB — LIPID PANEL
Cholesterol: 194 mg/dL (ref 0–200)
HDL: 30 mg/dL — ABNORMAL LOW (ref >40)
LDL Cholesterol: 120 mg/dL — ABNORMAL HIGH (ref 0–99)
Total CHOL/HDL Ratio: 6.5 RATIO
Triglycerides: 221 mg/dL — ABNORMAL HIGH (ref <150)
VLDL: 44 mg/dL — ABNORMAL HIGH (ref 0–40)

## 2020-10-20 LAB — ECHOCARDIOGRAM COMPLETE
AR max vel: 2.11 cm2
AV Area VTI: 2 cm2
AV Area mean vel: 2.09 cm2
AV Mean grad: 4 mmHg
AV Peak grad: 6.7 mmHg
Ao pk vel: 1.29 m/s
Area-P 1/2: 3.3 cm2
Height: 73 in
S' Lateral: 3.05 cm
Weight: 2960 oz

## 2020-10-20 LAB — BASIC METABOLIC PANEL
Anion gap: 10 (ref 5–15)
BUN: 16 mg/dL (ref 6–20)
CO2: 29 mmol/L (ref 22–32)
Calcium: 9.2 mg/dL (ref 8.9–10.3)
Chloride: 89 mmol/L — ABNORMAL LOW (ref 98–111)
Creatinine, Ser: 1.1 mg/dL (ref 0.61–1.24)
GFR, Estimated: >60 mL/min (ref >60)
Glucose, Bld: 134 mg/dL — ABNORMAL HIGH (ref 70–99)
Potassium: 3 mmol/L — ABNORMAL LOW (ref 3.5–5.1)
Sodium: 128 mmol/L — ABNORMAL LOW (ref 135–145)

## 2020-10-20 MED ORDER — SENNOSIDES-DOCUSATE SODIUM 8.6-50 MG PO TABS: 1.0000 | ORAL_TABLET | Freq: Every evening | ORAL | Status: DC | PRN

## 2020-10-20 MED ORDER — ACETAMINOPHEN 650 MG RE SUPP: 650.0000 mg | RECTAL | Status: DC | PRN

## 2020-10-20 MED ORDER — STROKE: EARLY STAGES OF RECOVERY BOOK: Freq: Once | Status: DC

## 2020-10-20 MED ORDER — LORAZEPAM 2 MG PO TABS: 0.0000 mg | ORAL_TABLET | Freq: Four times a day (QID) | ORAL | Status: DC | PRN

## 2020-10-20 MED ORDER — PROMETHAZINE HCL 25 MG/ML IJ SOLN
12.5000 mg | Freq: Once | INTRAMUSCULAR | Status: AC
  Administered 2020-10-20: 21:00:00 12.5 mg via INTRAVENOUS
  Filled 2020-10-20: qty 1

## 2020-10-20 MED ORDER — ASPIRIN 325 MG PO TABS
325.0000 mg | ORAL_TABLET | Freq: Every day | ORAL | Status: DC
  Filled 2020-10-20 (×3): qty 1

## 2020-10-20 MED ORDER — ENOXAPARIN SODIUM 40 MG/0.4ML ~~LOC~~ SOLN
40.0000 mg | SUBCUTANEOUS | Status: DC
  Administered 2020-10-20: 40 mg via SUBCUTANEOUS
  Filled 2020-10-20: qty 0.4

## 2020-10-20 MED ORDER — ACETAMINOPHEN 325 MG PO TABS
650.0000 mg | ORAL_TABLET | ORAL | Status: DC | PRN
  Administered 2020-10-20 (×2): 650 mg via ORAL
  Filled 2020-10-20 (×2): qty 2

## 2020-10-20 MED ORDER — ATORVASTATIN CALCIUM 20 MG PO TABS
40.0000 mg | ORAL_TABLET | Freq: Every day | ORAL | Status: DC
  Administered 2020-10-20: 40 mg via ORAL
  Filled 2020-10-20: qty 2

## 2020-10-20 MED ORDER — LORAZEPAM 2 MG/ML IJ SOLN: 0.0000 mg | Freq: Two times a day (BID) | INTRAMUSCULAR | Status: DC | PRN

## 2020-10-20 MED ORDER — ACETAMINOPHEN 160 MG/5ML PO SOLN
650.0000 mg | ORAL | Status: DC | PRN
  Filled 2020-10-20: qty 20.3

## 2020-10-20 MED ORDER — SODIUM CHLORIDE 0.9 % IV SOLN: INTRAVENOUS | Status: DC

## 2020-10-20 MED ORDER — LORAZEPAM 2 MG PO TABS: 0.0000 mg | ORAL_TABLET | Freq: Two times a day (BID) | ORAL | Status: DC | PRN

## 2020-10-20 MED ORDER — POTASSIUM CHLORIDE CRYS ER 20 MEQ PO TBCR
40.0000 meq | EXTENDED_RELEASE_TABLET | ORAL | Status: AC
  Administered 2020-10-20 (×2): 40 meq via ORAL
  Filled 2020-10-20 (×2): qty 2

## 2020-10-20 MED ORDER — CARVEDILOL 3.125 MG PO TABS
6.2500 mg | ORAL_TABLET | Freq: Two times a day (BID) | ORAL | Status: DC
  Administered 2020-10-20 – 2020-10-21 (×2): 6.25 mg via ORAL
  Filled 2020-10-20 (×3): qty 2

## 2020-10-20 MED ORDER — LORAZEPAM 2 MG/ML IJ SOLN
0.0000 mg | Freq: Four times a day (QID) | INTRAMUSCULAR | Status: DC | PRN
  Administered 2020-10-20: 4 mg via INTRAVENOUS
  Filled 2020-10-20: qty 2

## 2020-10-20 MED ORDER — PAROXETINE HCL 30 MG PO TABS
30.0000 mg | ORAL_TABLET | Freq: Every day | ORAL | Status: DC
  Administered 2020-10-20 – 2020-10-21 (×2): 30 mg via ORAL
  Filled 2020-10-20 (×2): qty 1

## 2020-10-20 MED ORDER — PRAVASTATIN SODIUM 20 MG PO TABS
20.0000 mg | ORAL_TABLET | Freq: Every day | ORAL | Status: DC
  Administered 2020-10-20: 20 mg via ORAL
  Filled 2020-10-20: qty 1

## 2020-10-20 MED ORDER — HYDROCHLOROTHIAZIDE 12.5 MG PO CAPS: 12.5000 mg | ORAL_CAPSULE | Freq: Every day | ORAL | Status: DC

## 2020-10-20 MED ORDER — POTASSIUM CHLORIDE IN NACL 40-0.9 MEQ/L-% IV SOLN
INTRAVENOUS | Status: AC
  Filled 2020-10-20: qty 1000

## 2020-10-20 NOTE — Progress Notes (Signed)
*  PRELIMINARY RESULTS* Echocardiogram 2D Echocardiogram has been performed.  Joanette Gula Avianna Moynahan 10/20/2020, 8:49 AM

## 2020-10-20 NOTE — Progress Notes (Signed)
Pt arrived from ED for continued medical treatment.  Alert and oriented on room air.  Stated girlfriend at bedside.  Pt is independently ambulatory at this time

## 2020-10-20 NOTE — Consult Note (Signed)
PHARMACY CONSULT NOTE - FOLLOW UP  Pharmacy Consult for Electrolyte Monitoring and Replacement   Recent Labs: Potassium (mmol/L)  Date Value  10/20/2020 3.0 (L)   Magnesium (mg/dL)  Date Value  93/57/0177 2.3   Calcium (mg/dL)  Date Value  93/90/3009 9.2   Albumin (g/dL)  Date Value  23/30/0762 5.0   Sodium (mmol/L)  Date Value  10/20/2020 128 (L)     Assessment: 54 y.o.malewith medical history significant oftobacco and alcohol abuse, recent injury with chronic headaches who apparently has been having persistent headache mainly on the left side and electrolyte abnormalities.  Pharmacy has been consulted to monitor and replenish potassium.  Goal of Therapy:  Potassium wnl's  Plan:  Will dose KCL x 2 and recheck electrolytes with am labs.  Albina Billet ,PharmD Clinical Pharmacist 10/20/2020 3:19 PM

## 2020-10-20 NOTE — Evaluation (Signed)
Occupational Therapy Evaluation Patient Details Name: Julian Simmons. MRN: [id number] DOB: [birth year] Today's Date: 10/20/2020    History of Present Illness Julian Simmons. is a 54 y.o. male with a past medical history of DM, HTN, and HDL who presents for assessment of headache that began 1 week ago and has since improved but became associated with double vision and decreased movement of the left eye 3 to 4 days ago. Imaging negative for acute infarct.   Clinical Impression   [name] was seen for OT evaluation this date. 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 Pt lives with his spouse in a 1 story "beach style" home with ~2 flights of steps to enter with R hand rail. Currently pt reporting migraine symptoms have resolved. Pt demonstrates baseline independence to perform ADL and mobility tasks and no strength, sensory, coordination, or cognitive appreciated with assessment. Pt endorses ongoing L eye "pressure" with increased double vision/nausea with L eye open. He states that if he keeps his L eye closed he has no difficulties with ADL management. Per pt, MD plans to provide eye patch prior to DC. Pt/caregiver educated on OP-OT options for potential f/u if visual challenges persist. This Thereasa Simmons also reviews BE FAST stroke symptom recognition and response education. Pt/caregiver return verbalize understanding of education provided. No further skilled OT needs identified. Will sign off at this time. Please re-consult if additional OT needs arise during this admission.     Follow Up Recommendations  No OT follow up    Equipment Recommendations  None recommended by OT    Recommendations for Other Services       Precautions / Restrictions Precautions Precautions: Fall Precaution Comments: Low fall      Mobility Bed Mobility Overal bed mobility:  Independent                  Transfers Overall transfer level: Independent               General transfer comment: Good safety awareness/control with functional transfers this date.    Balance Overall balance assessment: No apparent balance deficits (not formally assessed)                                         ADL either performed or assessed with clinical judgement   ADL Overall ADL's : At baseline;Independent                                       General ADL Comments: Pt presents at baseline level of functional independence for ADL management. He denies focal weakness, numbness, or cognitive challenges. Pt states headache symptoms have generally resolved since admission.     Vision Baseline Vision/History: Wears glasses Wears Glasses: Reading only Patient Visual Report: Diplopia;Other (comment) (L eye pain/migraine) Additional Comments: Pt keeps L eye closed as it improves double vision and minimizes nausea. Pt endorses top/bottom split double vision. He is able to see well out of R eye and states with L eye close vision is at or near baseline "minus a little depth perception".     Perception     Praxis      Pertinent Vitals/Pain Pain Assessment:  0-10 Pain Score: 1  Pain Location: Behind L eye Pain Descriptors / Indicators: Pressure Pain Intervention(s): Limited activity within patient's tolerance;Monitored during session     Hand Dominance Right   Extremity/Trunk Assessment Upper Extremity Assessment Upper Extremity Assessment: Overall WFL for tasks assessed   Lower Extremity Assessment Lower Extremity Assessment: Overall WFL for tasks assessed   Cervical / Trunk Assessment Cervical / Trunk Assessment: Normal   Communication Communication Communication: No difficulties   Cognition Arousal/Alertness: Awake/alert Behavior During Therapy: WFL for tasks assessed/performed Overall Cognitive Status: Within  Functional Limits for tasks assessed                                 General Comments: Pt pleasant, conversational, follows all VCs consistently t/o session.   General Comments       Exercises Other Exercises Other Exercises: Pt/caregiver (SO at bedside) educated on role of OT in acute setting, BE FAST stroke s/s recognition and response, OP OT options to adress visual difficulties if visual challenges in L eye persist including community driving evaluation and [facility].   Shoulder Instructions      Home Living Family/patient expects to be discharged to:: Private residence Living Arrangements: Spouse/significant other Available Help at Discharge: Family;Available 24 hours/day (Spouse works from home) Type of Home: House (Pt endorses it is Environmental health practitioner style" house with approx 2 full flights of steps (landing in middle) up to front door.) Home Access: Stairs to enter [employer] of Steps: ~18-20 Entrance Stairs-Rails: Right (Pt living area is abover garage, pt/wife estimate ~2 flights (with landing in middle) up to door.) Home Layout: One level     Bathroom Shower/Tub: IT trainer: Standard     Home Equipment: None          Prior Functioning/Environment Level of Independence: Independent        Comments: Pt endorses he was active and indep prior to onset of symptoms. Working active job in Engineering geologist.        OT Problem List: Impaired vision/perception;Decreased safety awareness      OT Treatment/Interventions:      OT Goals(Current goals can be found in the care plan section) Acute Rehab OT Goals Patient Stated Goal: To go home ASAP OT Goal Formulation: All assessment and education complete, DC therapy Time For Goal Achievement: 10/20/20  OT Frequency:     Barriers to D/C:            Co-evaluation              AM-PAC OT "6 Clicks" Daily Activity     Outcome Measure Help from  another person eating meals?: None Help from another person taking care of personal grooming?: None Help from another person toileting, which includes using toliet, bedpan, or urinal?: None Help from another person bathing (including washing, rinsing, drying)?: None Help from another person to put on and taking off regular upper body clothing?: None Help from another person to put on and taking off regular lower body clothing?: None 6 Click Score: 24   End of Session    Activity Tolerance: Patient tolerated treatment well Patient left: in bed;with call bell/phone within reach (Pt recieved with bed alarm off. Oriented to call bell for needs.)  OT Visit Diagnosis: Other abnormalities of gait and mobility (R26.89)                Time:  1610-9604 OT Time Calculation (min): 16 min Charges:  OT General Charges $OT Visit: 1 Visit OT Evaluation $OT Eval Low Complexity: 1 Low OT Treatments $Self Care/Home Management : 8-22 mins  Rockney Ghee, M.S., OTR/L Ascom: [phone] 10/20/20, 2:05 PM

## 2020-10-20 NOTE — Progress Notes (Signed)
PT Cancellation Note  Patient Details Name: Julian Simmons. MRN: [id number] DOB: [birth year]   Cancelled Treatment:    Reason Eval/Treat Not Completed: Other (comment) Pt resting in bed with lights dimmed upon arrival to room. Pt awoke with elevated volume from clinician and stated that he does not feel like he needs physical therapy at this time. Pt noted to keep L eye closed as he reports that he sees double vision when it is open. He also endorses that the doctor is going send him home with an eye patch for his L eye. Pt stated that he got up with OT earlier and felt steady walking and had no LOB even with his L eye closed. Spoke with nurse Rushie Goltz who verifies that pt has been walking independently in room without difficulty. Will sign off at this time. If needs arise or pt declines in function, please re-consult [place].   Frederich Chick 10/20/2020, 2:26 PM

## 2020-10-20 NOTE — Progress Notes (Addendum)
[facility]  - [facility]   PATIENT NAME: Julian Simmons    MR#:  [id number]  DATE OF BIRTH:  1966-05-20  SUBJECTIVE:   Came in after having significant left sided headache more than right. Complains of left eyelid drooping. Double vision when tries to see with both eyes. Feels better with closed eye. Headache little better. Family at bedside. Tolerating PO diet. REVIEW OF SYSTEMS:   Review of Systems  Constitutional: Negative for chills, fever and weight loss.  HENT: Negative for ear discharge, ear pain and nosebleeds.   Eyes: Positive for double vision. Negative for blurred vision, pain and discharge.  Respiratory: Negative for sputum production, shortness of breath, wheezing and stridor.   Cardiovascular: Negative for chest pain, palpitations, orthopnea and PND.  Gastrointestinal: Negative for abdominal pain, diarrhea, nausea and vomiting.  Genitourinary: Negative for frequency and urgency.  Musculoskeletal: Negative for back pain and joint pain.  Neurological: Positive for headaches. Negative for sensory change, speech change, focal weakness and weakness.  Psychiatric/Behavioral: Negative for depression and hallucinations. The patient is not nervous/anxious.    Tolerating Diet:yes Tolerating PT: no PT needs  DRUG ALLERGIES:   Allergies  Allergen Reactions  . Tetanus Toxoid     VITALS:  Blood pressure 129/85, pulse 72, temperature 98.7 F (37.1 C), temperature source Oral, resp. rate 16, height 6\' 1"  (1.854 m), weight 83.9 kg, SpO2 96 %.  PHYSICAL EXAMINATION:   Physical Exam  GENERAL:  54 y.o.-year-old patient lying in the bed with no acute distress.  EYES: Pupils equal, round, reactive to light and accommodation. No scleral icterus.   Ptosis left eye NECK:  Supple, no jugular venous distention. No thyroid enlargement, no tenderness.  LUNGS: Normal breath sounds bilaterally, no wheezing, rales, rhonchi. No use of accessory muscles of  respiration.  CARDIOVASCULAR: S1, S2 normal. No murmurs, rubs, or gallops.  ABDOMEN: Soft, nontender, nondistended. Bowel sounds present. No organomegaly or mass.  EXTREMITIES: No cyanosis, clubbing or edema b/l.    NEUROLOGIC: Cranial nerves II through XII are intact. No focal Motor or sensory deficits b/l.   PSYCHIATRIC:  patient is alert and oriented x 3.  SKIN: No obvious rash, lesion, or ulcer.   LABORATORY PANEL:  CBC Recent Labs  Lab 10/20/20 0126  WBC 8.0  HGB 16.4  HCT 44.7  PLT 243    Chemistries  Recent Labs  Lab 10/19/20 1935 10/19/20 1935 10/19/20 2130 10/20/20 0126  NA 124*  --   --   --   K 2.8*  --   --   --   CL 81*  --   --   --   CO2 27  --   --   --   GLUCOSE 145*  --   --   --   BUN 17  --   --   --   CREATININE 1.17   < >  --  1.07  CALCIUM 9.8  --   --   --   MG  --   --  2.3  --   AST  --   --  34  --   ALT  --   --  37  --   ALKPHOS  --   --  68  --   BILITOT  --   --  1.1  --    < > = values in this interval not displayed.   Cardiac Enzymes No results for input(s): TROPONINI in the last 168 hours.  RADIOLOGY:  CT Angio Head W or Wo Contrast  Result Date: 10/19/2020 CLINICAL DATA:  Initial evaluation for headache, double vision. EXAM: CT ANGIOGRAPHY HEAD AND NECK TECHNIQUE: Multidetector CT imaging of the head and neck was performed using the standard protocol during bolus administration of intravenous contrast. Multiplanar CT image reconstructions and MIPs were obtained to evaluate the vascular anatomy. Carotid stenosis measurements (when applicable) are obtained utilizing NASCET criteria, using the distal internal carotid diameter as the denominator. CONTRAST:  72mL OMNIPAQUE IOHEXOL 350 MG/ML SOLN COMPARISON:  None available. FINDINGS: CT HEAD FINDINGS Brain: Cerebral volume within normal limits for patient age. No evidence for acute intracranial hemorrhage. No findings to suggest acute large vessel territory infarct. No mass lesion, midline  shift, or mass effect. Ventricles are normal in size without evidence for hydrocephalus. No extra-axial fluid collection identified. Vascular: No hyperdense vessel identified. Skull: Scalp soft tissues demonstrate no acute abnormality. Calvarium intact. Sinuses/Orbits: Globes and orbital soft tissues within normal limits. Visualized paranasal sinuses are clear. No mastoid effusion. CTA NECK FINDINGS Aortic arch: Visualized aortic arch of normal caliber with normal 3 vessel morphology. Mild atheromatous change at the origin of the left subclavian artery without significant stenosis. No hemodynamically significant stenosis about the origin of the great vessels. Right carotid system: Right common and internal carotid arteries widely patent without stenosis, dissection or occlusion. Mild atheromatous irregularity about the right bifurcation without significant stenosis. Left carotid system: Left common and internal carotid arteries widely patent without stenosis, dissection, or occlusion. Mild atheromatous irregularity about the left bifurcation without significant stenosis. Vertebral arteries: Both vertebral arteries arise from the subclavian arteries. No proximal subclavian artery stenosis. Atheromatous plaque at the origin of the left vertebral artery with associated stenosis of up to 40%. Left vertebral artery dominant, with a diffusely hypoplastic right vertebral artery. Vertebral arteries otherwise patent within the neck without stenosis, dissection or occlusion. Skeleton: No acute visible acute osseous abnormality. No discrete or worrisome osseous lesions. Moderate multilevel cervical spondylosis without high-grade spinal stenosis. Other neck: No other acute soft tissue abnormality within the neck. No mass lesion or adenopathy. Upper chest: Visualized upper chest demonstrates no acute finding. Mild upper lobe paraseptal emphysematous changes. Review of the MIP images confirms the above findings CTA HEAD FINDINGS  Anterior circulation: Petrous segments patent bilaterally. Cavernous and supraclinoid ICAs widely patent without stenosis. Subtle 2 mm focal outpouching extending inferiorly from the cavernous right ICA could reflect a small vascular infundibulum versus aneurysm (series 515, image 88). ICA termini well perfused. A1 segments patent bilaterally. Normal anterior communicating artery complex. Anterior cerebral arteries widely patent to their distal aspects. No M1 stenosis or occlusion. Normal MCA bifurcations. Distal MCA branches well perfused and symmetric. Posterior circulation: Dominant left vertebral artery widely patent to the vertebrobasilar junction. Patent left PICA. Hypoplastic right vertebral artery terminates in PICA. Right PICA patent as well. Basilar widely patent to its distal aspect. Superior cerebral arteries patent bilaterally. Both PCAs primarily supplied via the basilar well perfused to their distal aspects. Venous sinuses: Grossly patent allowing for timing the contrast bolus. Anatomic variants: Hypoplastic right vertebral artery terminates in PICA. Review of the MIP images confirms the above findings IMPRESSION: CT HEAD IMPRESSION: Normal head CT for age. No acute intracranial abnormality identified. CTA HEAD AND NECK IMPRESSION: 1. Negative CTA for large vessel occlusion or other acute vascular abnormality. 2. 2 mm focal outpouching extending inferiorly from the cavernous right ICA, which could reflect a small vascular infundibulum versus aneurysm. 3. 40% atheromatous stenosis  at the origin of the left vertebral artery. Left vertebral artery dominant. Right vertebral artery diffusely hypoplastic and terminates in PICA. 4. Emphysema (ICD10-J43.9). Electronically Signed   By: Rise Mu M.D.   On: 10/19/2020 20:58   CT Angio Neck W and/or Wo Contrast  Result Date: 10/19/2020 CLINICAL DATA:  Initial evaluation for headache, double vision. EXAM: CT ANGIOGRAPHY HEAD AND NECK TECHNIQUE:  Multidetector CT imaging of the head and neck was performed using the standard protocol during bolus administration of intravenous contrast. Multiplanar CT image reconstructions and MIPs were obtained to evaluate the vascular anatomy. Carotid stenosis measurements (when applicable) are obtained utilizing NASCET criteria, using the distal internal carotid diameter as the denominator. CONTRAST:  48mL OMNIPAQUE IOHEXOL 350 MG/ML SOLN COMPARISON:  None available. FINDINGS: CT HEAD FINDINGS Brain: Cerebral volume within normal limits for patient age. No evidence for acute intracranial hemorrhage. No findings to suggest acute large vessel territory infarct. No mass lesion, midline shift, or mass effect. Ventricles are normal in size without evidence for hydrocephalus. No extra-axial fluid collection identified. Vascular: No hyperdense vessel identified. Skull: Scalp soft tissues demonstrate no acute abnormality. Calvarium intact. Sinuses/Orbits: Globes and orbital soft tissues within normal limits. Visualized paranasal sinuses are clear. No mastoid effusion. CTA NECK FINDINGS Aortic arch: Visualized aortic arch of normal caliber with normal 3 vessel morphology. Mild atheromatous change at the origin of the left subclavian artery without significant stenosis. No hemodynamically significant stenosis about the origin of the great vessels. Right carotid system: Right common and internal carotid arteries widely patent without stenosis, dissection or occlusion. Mild atheromatous irregularity about the right bifurcation without significant stenosis. Left carotid system: Left common and internal carotid arteries widely patent without stenosis, dissection, or occlusion. Mild atheromatous irregularity about the left bifurcation without significant stenosis. Vertebral arteries: Both vertebral arteries arise from the subclavian arteries. No proximal subclavian artery stenosis. Atheromatous plaque at the origin of the left vertebral  artery with associated stenosis of up to 40%. Left vertebral artery dominant, with a diffusely hypoplastic right vertebral artery. Vertebral arteries otherwise patent within the neck without stenosis, dissection or occlusion. Skeleton: No acute visible acute osseous abnormality. No discrete or worrisome osseous lesions. Moderate multilevel cervical spondylosis without high-grade spinal stenosis. Other neck: No other acute soft tissue abnormality within the neck. No mass lesion or adenopathy. Upper chest: Visualized upper chest demonstrates no acute finding. Mild upper lobe paraseptal emphysematous changes. Review of the MIP images confirms the above findings CTA HEAD FINDINGS Anterior circulation: Petrous segments patent bilaterally. Cavernous and supraclinoid ICAs widely patent without stenosis. Subtle 2 mm focal outpouching extending inferiorly from the cavernous right ICA could reflect a small vascular infundibulum versus aneurysm (series 515, image 88). ICA termini well perfused. A1 segments patent bilaterally. Normal anterior communicating artery complex. Anterior cerebral arteries widely patent to their distal aspects. No M1 stenosis or occlusion. Normal MCA bifurcations. Distal MCA branches well perfused and symmetric. Posterior circulation: Dominant left vertebral artery widely patent to the vertebrobasilar junction. Patent left PICA. Hypoplastic right vertebral artery terminates in PICA. Right PICA patent as well. Basilar widely patent to its distal aspect. Superior cerebral arteries patent bilaterally. Both PCAs primarily supplied via the basilar well perfused to their distal aspects. Venous sinuses: Grossly patent allowing for timing the contrast bolus. Anatomic variants: Hypoplastic right vertebral artery terminates in PICA. Review of the MIP images confirms the above findings IMPRESSION: CT HEAD IMPRESSION: Normal head CT for age. No acute intracranial abnormality identified. CTA HEAD AND NECK  IMPRESSION: 1. Negative CTA for large vessel occlusion or other acute vascular abnormality. 2. 2 mm focal outpouching extending inferiorly from the cavernous right ICA, which could reflect a small vascular infundibulum versus aneurysm. 3. 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 Left vertebral artery dominant. Right vertebral artery diffusely hypoplastic and terminates in PICA. 4. Emphysema (ICD10-J43.9). Electronically Signed   By: Rise Mu M.D.   On: 10/19/2020 20:58   MR BRAIN WO CONTRAST  Result Date: 10/20/2020 CLINICAL DATA:  54 year old male presenting with headache and double vision. Third cranial nerve palsy. EXAM: MRI HEAD WITHOUT CONTRAST TECHNIQUE: Multiplanar, multiecho pulse sequences of the brain and surrounding structures were obtained without intravenous contrast. COMPARISON:  CTA head and neck earlier tonight. FINDINGS: Brain: No restricted diffusion to suggest acute infarction. No midline shift, mass effect, evidence of mass lesion, ventriculomegaly, extra-axial collection or acute intracranial hemorrhage. Cervicomedullary junction and pituitary are within normal limits. Cerebral volume is within normal limits for age. Wallace Cullens and white matter signal is within normal limits for age throughout the brain; Minimal nonspecific white matter T2 and FLAIR hyperintensity. No cortical encephalomalacia or chronic cerebral blood products. Deep gray matter nuclei and brainstem within normal limits. Grossly normal cisternal 3rd nerve segments on series 16. Unremarkable noncontrast appearance of the cavernous sinus. Vascular: Major intracranial vascular flow voids are preserved. Dominant left vertebral artery as on the comparison. Skull and upper cervical spine: Partially visible disc and endplate degeneration at C3-C4. Elsewhere bone marrow signal is within normal limits. Sinuses/Orbits: Normal suprasellar cistern and optic chiasm. Unremarkable noncontrast cavernous  sinus. Mildly Disconjugate gaze. Intraorbital soft tissues otherwise appear symmetric and within normal limits. Paranasal sinuses and mastoids are stable and well pneumatized. Other: Visible internal auditory structures appear normal. Visible scalp and face appear negative. IMPRESSION: 1. No acute intracranial abnormality and essentially normal for age noncontrast MRI appearance of the brain. 2. 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 Electronically Signed   By: Odessa Fleming M.D.   On: 10/20/2020 02:57   ASSESSMENT AND PLAN:   Julian Glendening. is a 54 y.o. male with medical history significant of tobacco and alcohol abuse, recent injury with chronic headaches who apparently has been having persistent headache mainly on the left side.  In the last 2 to 3 weeks he has noticed his left eye is not closing.  His headache got worse a week ago where he felt like there was ever headache.  #1 bilateral headaches: Acute on chronic.   -prn fioricet -patient does not take anything as prophylaxis for migraine. He is not headed in a few years. -Await neurology consultation - MRI brain negative for stroke - -CTA headed neck no severe intravascular or neck vssel stenosis. 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  Again get neurology to help with evaluation.   -MRI of the brain, CTA results noted. -Provide eyepatch  #3 alcohol abuse: CIWA protocol initiated. -Scoring low  #4 tobacco abuse: Counseling with nicotine patch offered.  #5 hyponatremia: Most likely as result of alcoholism. IV hydration.  Hydrate gently.  Monitor closely sodium level. -Check metabolic panel  #6 hypokalemia: Continue to replete potassium. -Received 80 M EQ potassium.  -Check BMP  #7 severe depression:  -resume Paxil  DVT prophylaxis: Lovenox Code Status: Full code Family Communication: family at bedside Disposition  Plan: To be determined Consults called:  neurology consultation with  Dr. Otelia LimesLindzen pending Admission status: Inpatient    Dispo: The patient is from: Home              Anticipated d/c is to: Home              Anticipated d/c date is: 1 day              Patient currently is not medically stable to d/c.  Pending neurology consultation. Patient may need IV potassium repletion. Awaiting metabolic panel results.      TOTAL TIME TAKING CARE OF THIS PATIENT: 25 minutes.  >50% time spent on counselling and coordination of care  Note: This dictation was prepared with Dragon dictation along with smaller phrase technology. Any transcriptional errors that result from this process are unintentional.  Enedina FinnerSona Rozalyn Osland M.D    [facility]   CC: Primary care physician; Aida PufferLittle, James, MDPatient ID: Kerrie BuffaloBobby L Loder Jr., male   DOB: 07-03-1966, 54 y.o.   MRN: [id number]

## 2020-10-20 NOTE — Progress Notes (Signed)
Eye patch placed on L eye per order.  Pt verbalized that it felt much better with the cover

## 2020-10-20 NOTE — Progress Notes (Signed)
SLP Cancellation Note  Patient Details Name: Kyrie Fludd. MRN: [id number] DOB: [birth year]   Cancelled treatment:       Reason Eval/Treat Not Completed: SLP screened, no needs identified, will sign off   Chart review, pt and his wife interviewed with no acute deficits identified.   Nursing made aware that pt passed the [facility] Screen with nursing. Per protocol nursing is ok to place diet order.   Kaysee Hergert B. Dreama Saa M.S., CCC-SLP, [facility] Speech-Language Pathologist Rehabilitation Services Office [phone]    Reuel Derby 10/20/2020, 9:38 AM

## 2020-10-21 DIAGNOSIS — I10 Essential (primary) hypertension: Secondary | ICD-10-CM

## 2020-10-21 LAB — BASIC METABOLIC PANEL
Anion gap: 9 (ref 5–15)
BUN: 15 mg/dL (ref 6–20)
CO2: 26 mmol/L (ref 22–32)
Calcium: 9.2 mg/dL (ref 8.9–10.3)
Chloride: 99 mmol/L (ref 98–111)
Creatinine, Ser: 1.01 mg/dL (ref 0.61–1.24)
GFR, Estimated: >60 mL/min (ref >60)
Glucose, Bld: 106 mg/dL — ABNORMAL HIGH (ref 70–99)
Potassium: 3.7 mmol/L (ref 3.5–5.1)
Sodium: 134 mmol/L — ABNORMAL LOW (ref 135–145)

## 2020-10-21 LAB — HEMOGLOBIN A1C
Hgb A1c MFr Bld: 6 % — ABNORMAL HIGH (ref 4.8–5.6)
Mean Plasma Glucose: 126 mg/dL

## 2020-10-21 MED ORDER — BUTALBITAL-APAP-CAFFEINE 50-325-40 MG PO TABS: 1.0000 | ORAL_TABLET | Freq: Two times a day (BID) | ORAL | 0 refills | Status: AC | PRN

## 2020-10-21 MED ORDER — BUTALBITAL-APAP-CAFFEINE 50-325-40 MG PO TABS: 1.0000 | ORAL_TABLET | Freq: Two times a day (BID) | ORAL | Status: DC | PRN

## 2020-10-21 MED ORDER — ASPIRIN 81 MG PO TBEC: 81.0000 mg | DELAYED_RELEASE_TABLET | Freq: Every day | ORAL | 11 refills | Status: AC

## 2020-10-21 MED ORDER — ASPIRIN EC 81 MG PO TBEC
81.0000 mg | DELAYED_RELEASE_TABLET | Freq: Every day | ORAL | Status: DC
  Administered 2020-10-21: 09:00:00 81 mg via ORAL
  Filled 2020-10-21: qty 1

## 2020-10-21 NOTE — Discharge Summary (Signed)
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 Elevated PSA     HOSPITAL COURSE:   Julian Simmonsis a 54 y.o.malewith medical history significant oftobacco and alcohol abuse, recent injury with chronic headaches who apparently has been having persistent headache mainly on the left side. In the last 2 to 3 weeks he has noticed his left eye is not closing. His headache got worse a week ago where he felt like there was ever headache.  #1 bilateral headaches:Acute on chronic.appears complicated migraine HA  -prn fioricet -patient does not take anything as prophylaxis for migraine. -Await neurology consultation--Dr Lindzen gave recommendations on the phone--to get MRI Orbit as out pt if no improvement--will defer to PCP. Pt informed to f/u. -PCP can make out pt referral to Neurology - MRI brain negative for stroke  -CTA head and neck no severe intravascular or neck vssel stenosis. 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  -MRI of the brain, CTA results noted. -Provide eyepatch -f/u out pt neuro and ophthalmology (as needed)--d/w pt today  #3 alcohol abuse:CIWA protocol initiated. -Scoring  low -d/c ativan  #4 tobacco abuse:Counseling with nicotine patch offered.  #5 hyponatremia:Most likely as result of alcoholism and poor po intake - IV hydration.  -Na 121--134 -d/ced HCTZ  #6 hypokalemia:Continue to replete potassium. -Received 80 M EQ potassium.  -K 3.7  #7 h/o depression: -resume Paxil  #8 HTN -cont coreg bp stable (114/86) -D/ced HCTZ for now due to well. BP and electrolyte abnormality. Can be resumed later date if needed-- defer to PCP -patient is not taking lisinopril (per records)  DVT prophylaxis:Lovenox Code Status:Full code Family Communication:family at bedside Disposition Plan:To be determined Consults called: neurology consultation with Dr. Otelia LimesLindzen-- recommendations given over the phone on 10 /22/21 Admission status:Inpatient    Dispo: The patient is from: Home  Anticipated d/c is to: Home  Anticipated d/c date is: today  Patient currently is  medically stable to d/c.   Discussed discharge planning with patient. CONSULTS OBTAINED:  Treatment Team:  Caryl PinaLindzen, Eric, MD  DRUG ALLERGIES:   Allergies  Allergen Reactions  . Tetanus Toxoid     DISCHARGE MEDICATIONS:   Allergies as of 10/21/2020      Reactions   Tetanus Toxoid       Medication List    STOP taking these medications   hydrochlorothiazide 12.5 MG capsule Commonly known as: MICROZIDE   lisinopril 5 MG tablet Commonly known as: ZESTRIL     TAKE these medications   aspirin 81 MG EC tablet Take 1 tablet (81 mg total) by mouth daily. Swallow whole.   butalbital-acetaminophen-caffeine 50-325-40 MG tablet Commonly known as: FIORICET Take 1 tablet by mouth 2 (two) times daily as needed for headache.   carvedilol 6.25 MG tablet Commonly known as: COREG Take 6.25 mg by mouth  2 (two) times daily.   lovastatin 10 MG tablet Commonly known as: MEVACOR TAKE 1 TABLET BY MOUTH ONCE DAILY WITH EVENING MEAL    PARoxetine 30 MG tablet Commonly known as: PAXIL Take 30 mg by mouth daily.   temazepam 15 MG capsule Commonly known as: RESTORIL TAKE 1 CAPSULE BY MOUTH ONCE DAILY AT BEDTIME AS NEEDED       If you experience worsening of your admission symptoms, develop shortness of breath, life threatening emergency, suicidal or homicidal thoughts you must seek medical attention immediately by calling 911 or calling your MD immediately  if symptoms less severe.  You Must read complete instructions/literature along with all the possible adverse reactions/side effects for all the Medicines you take and that have been prescribed to you. Take any new Medicines after you have completely understood and accept all the possible adverse reactions/side effects.   Please note  You were cared for by a hospitalist during your hospital stay. If you have any questions about your discharge medications or the care you received while you were in the hospital after you are discharged, you can call the unit and asked to speak with the hospitalist on call if the hospitalist that took care of you is not available. Once you are discharged, your primary care physician will handle any further medical issues. Please note that NO REFILLS for any discharge medications will be authorized once you are discharged, as it is imperative that you return to your primary care physician (or establish a relationship with a primary care physician if you do not have one) for your aftercare needs so that they can reassess your need for medications and monitor your lab values. Today   SUBJECTIVE   Headache resolved. No signs of alcohol withdrawal. Feels a lot better.  VITAL SIGNS:  Blood pressure 114/86, pulse 76, temperature 97.8 F (36.6 C), temperature source Oral, resp. rate 13, height  (1.854 m), weight 83.9 kg, SpO2 97 %.  I/O:    Intake/Output Summary (Last 24 hours) at 10/21/2020 0843 Last data filed at 10/20/2020 1900 Gross  per 24 hour  Intake 120 ml  Output --  Net 120 ml    PHYSICAL EXAMINATION:  GENERAL:  54 y.o.-year-old patient lying in the bed with no acute distress.  EYES: Pupils equal, round, reactive to light and accommodation. No scleral icterus.  HEENT: Head atraumatic, normocephalic. Oropharynx and nasopharynx clear.  NECK:  Supple, no jugular venous distention. No thyroid enlargement, no tenderness.  LUNGS: Normal breath sounds bilaterally, no wheezing, rales,rhonchi or crepitation. No use of accessory muscles of respiration.  CARDIOVASCULAR: S1, S2 normal. No murmurs, rubs, or gallops.  ABDOMEN: Soft, non-tender, non-distended. Bowel sounds present. No organomegaly or mass.  EXTREMITIES: No pedal edema, cyanosis, or clubbing.  NEUROLOGIC: Cranial nerves II through XII are intact. Muscle strength 5/5 in all extremities. Sensation intact. Gait not checked.  PSYCHIATRIC: The patient is alert and oriented x 3.  SKIN: No obvious rash, lesion, or ulcer.   DATA REVIEW:   CBC  Recent Labs  Lab 10/20/20 0126  WBC 8.0  HGB 16.4  HCT 44.7  PLT 243    Chemistries  Recent Labs  Lab 10/19/20 2130 10/20/20 0126 10/21/20 0430  NA  --    < > 134*  K  --    < > 3.7  CL  --    < > 99  CO2  --    < > 26  GLUCOSE  --    < >  106*  BUN  --    < > 15  CREATININE  --    < > 1.01  CALCIUM  --    < > 9.2  MG 2.3  --   --   AST 34  --   --   ALT 37  --   --   ALKPHOS 68  --   --   BILITOT 1.1  --   --    < > = values in this interval not displayed.    Microbiology Results   Recent Results (from the past 240 hour(s))  Respiratory Panel by RT PCR (Flu A&B, Covid) - Nasopharyngeal Swab     Status: None   Collection Time: 10/19/20  8:58 PM   Specimen: Nasopharyngeal Swab  Result Value Ref Range Status   SARS Coronavirus 2 by RT PCR NEGATIVE NEGATIVE Final    Comment: (NOTE) SARS-CoV-2 target nucleic acids are NOT DETECTED.  The SARS-CoV-2 RNA is generally detectable in upper  respiratoy specimens during the acute phase of infection. The lowest concentration of SARS-CoV-2 viral copies this assay can detect is 131 copies/mL. A negative result does not preclude SARS-Cov-2 infection and should not be used as the sole basis for treatment or other patient management decisions. A negative result may occur with  improper specimen collection/handling, submission of specimen other than nasopharyngeal swab, presence of viral mutation(s) within the areas targeted by this assay, and inadequate number of viral copies (<131 copies/mL). A negative result must be combined with clinical observations, patient history, and epidemiological information. The expected result is Negative.  Fact Sheet for Patients:  https://www.moore.com/  Fact Sheet for Healthcare Providers:  https://www.young.biz/  This test is no t yet approved or cleared by the [place] FDA and  has been authorized for detection and/or diagnosis of SARS-CoV-2 by FDA under an Emergency Use Authorization (EUA). This EUA will remain  in effect (meaning this test can be used) for the duration of the COVID-19 declaration under Section 564(b)(1) of the Act, 21 U.S.C. section 360bbb-3(b)(1), unless the authorization is terminated or revoked sooner.     Influenza A by PCR NEGATIVE NEGATIVE Final   Influenza B by PCR NEGATIVE NEGATIVE Final    Comment: (NOTE) The Xpert Xpress SARS-CoV-2/FLU/RSV assay is intended as an aid in  the diagnosis of influenza from Nasopharyngeal swab specimens and  should not be used as a sole basis for treatment. Nasal washings and  aspirates are unacceptable for Xpert Xpress SARS-CoV-2/FLU/RSV  testing.  Fact Sheet for Patients: https://www.moore.com/  Fact Sheet for Healthcare Providers: https://www.young.biz/  This test is not yet approved or cleared by the [place] FDA and  has been  authorized for detection and/or diagnosis of SARS-CoV-2 by  FDA under an Emergency Use Authorization (EUA). This EUA will remain  in effect (meaning this test can be used) for the duration of the  Covid-19 declaration under Section 564(b)(1) of the Act, 21  U.S.C. section 360bbb-3(b)(1), unless the authorization is  terminated or revoked. Performed at [facility], [address] [place], [place] [address]     RADIOLOGY:  CT Angio Head W or Wo Contrast  Result Date: 10/19/2020 CLINICAL DATA:  Initial evaluation for headache, double vision. EXAM: CT ANGIOGRAPHY HEAD AND NECK TECHNIQUE: Multidetector CT imaging of the head and neck was performed using the standard protocol during bolus administration of intravenous contrast. Multiplanar CT image reconstructions and MIPs were obtained to evaluate the vascular anatomy. Carotid stenosis measurements (when applicable)  are obtained utilizing NASCET criteria, using the distal internal carotid diameter as the denominator. CONTRAST:  40mL OMNIPAQUE IOHEXOL 350 MG/ML SOLN COMPARISON:  None available. FINDINGS: CT HEAD FINDINGS Brain: Cerebral volume within normal limits for patient age. No evidence for acute intracranial hemorrhage. No findings to suggest acute large vessel territory infarct. No mass lesion, midline shift, or mass effect. Ventricles are normal in size without evidence for hydrocephalus. No extra-axial fluid collection identified. Vascular: No hyperdense vessel identified. Skull: Scalp soft tissues demonstrate no acute abnormality. Calvarium intact. Sinuses/Orbits: Globes and orbital soft tissues within normal limits. Visualized paranasal sinuses are clear. No mastoid effusion. CTA NECK FINDINGS Aortic arch: Visualized aortic arch of normal caliber with normal 3 vessel morphology. Mild atheromatous change at the origin of the left subclavian artery without significant stenosis. No hemodynamically significant stenosis about the origin  of the great vessels. Right carotid system: Right common and internal carotid arteries widely patent without stenosis, dissection or occlusion. Mild atheromatous irregularity about the right bifurcation without significant stenosis. Left carotid system: Left common and internal carotid arteries widely patent without stenosis, dissection, or occlusion. Mild atheromatous irregularity about the left bifurcation without significant stenosis. Vertebral arteries: Both vertebral arteries arise from the subclavian arteries. No proximal subclavian artery stenosis. Atheromatous plaque at the origin of the left vertebral artery with associated stenosis of up to 40%. Left vertebral artery dominant, with a diffusely hypoplastic right vertebral artery. Vertebral arteries otherwise patent within the neck without stenosis, dissection or occlusion. Skeleton: No acute visible acute osseous abnormality. No discrete or worrisome osseous lesions. Moderate multilevel cervical spondylosis without high-grade spinal stenosis. Other neck: No other acute soft tissue abnormality within the neck. No mass lesion or adenopathy. Upper chest: Visualized upper chest demonstrates no acute finding. Mild upper lobe paraseptal emphysematous changes. Review of the MIP images confirms the above findings CTA HEAD FINDINGS Anterior circulation: Petrous segments patent bilaterally. Cavernous and supraclinoid ICAs widely patent without stenosis. Subtle 2 mm focal outpouching extending inferiorly from the cavernous right ICA could reflect a small vascular infundibulum versus aneurysm (series 515, image 88). ICA termini well perfused. A1 segments patent bilaterally. Normal anterior communicating artery complex. Anterior cerebral arteries widely patent to their distal aspects. No M1 stenosis or occlusion. Normal MCA bifurcations. Distal MCA branches well perfused and symmetric. Posterior circulation: Dominant left vertebral artery widely patent to the  vertebrobasilar junction. Patent left PICA. Hypoplastic right vertebral artery terminates in PICA. Right PICA patent as well. Basilar widely patent to its distal aspect. Superior cerebral arteries patent bilaterally. Both PCAs primarily supplied via the basilar well perfused to their distal aspects. Venous sinuses: Grossly patent allowing for timing the contrast bolus. Anatomic variants: Hypoplastic right vertebral artery terminates in PICA. Review of the MIP images confirms the above findings IMPRESSION: CT HEAD IMPRESSION: Normal head CT for age. No acute intracranial abnormality identified. CTA HEAD AND NECK IMPRESSION: 1. Negative CTA for large vessel occlusion or other acute vascular abnormality. 2. 2 mm focal outpouching extending inferiorly from the cavernous right ICA, which could reflect a small vascular infundibulum versus aneurysm. 3. 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 Left vertebral artery dominant. Right vertebral artery diffusely hypoplastic and terminates in PICA. 4. Emphysema (ICD10-J43.9). Electronically Signed   By: Rise Mu M.D.   On: 10/19/2020 20:58   CT Angio Neck W and/or Wo Contrast  Result Date: 10/19/2020 CLINICAL DATA:  Initial evaluation for headache, double vision. EXAM: CT ANGIOGRAPHY HEAD AND NECK TECHNIQUE:  Multidetector CT imaging of the head and neck was performed using the standard protocol during bolus administration of intravenous contrast. Multiplanar CT image reconstructions and MIPs were obtained to evaluate the vascular anatomy. Carotid stenosis measurements (when applicable) are obtained utilizing NASCET criteria, using the distal internal carotid diameter as the denominator. CONTRAST:  75mL OMNIPAQUE IOHEXOL 350 MG/ML SOLN COMPARISON:  None available. FINDINGS: CT HEAD FINDINGS Brain: Cerebral volume within normal limits for patient age. No evidence for acute intracranial hemorrhage. No findings to suggest acute large  vessel territory infarct. No mass lesion, midline shift, or mass effect. Ventricles are normal in size without evidence for hydrocephalus. No extra-axial fluid collection identified. Vascular: No hyperdense vessel identified. Skull: Scalp soft tissues demonstrate no acute abnormality. Calvarium intact. Sinuses/Orbits: Globes and orbital soft tissues within normal limits. Visualized paranasal sinuses are clear. No mastoid effusion. CTA NECK FINDINGS Aortic arch: Visualized aortic arch of normal caliber with normal 3 vessel morphology. Mild atheromatous change at the origin of the left subclavian artery without significant stenosis. No hemodynamically significant stenosis about the origin of the great vessels. Right carotid system: Right common and internal carotid arteries widely patent without stenosis, dissection or occlusion. Mild atheromatous irregularity about the right bifurcation without significant stenosis. Left carotid system: Left common and internal carotid arteries widely patent without stenosis, dissection, or occlusion. Mild atheromatous irregularity about the left bifurcation without significant stenosis. Vertebral arteries: Both vertebral arteries arise from the subclavian arteries. No proximal subclavian artery stenosis. Atheromatous plaque at the origin of the left vertebral artery with associated stenosis of up to 40%. Left vertebral artery dominant, with a diffusely hypoplastic right vertebral artery. Vertebral arteries otherwise patent within the neck without stenosis, dissection or occlusion. Skeleton: No acute visible acute osseous abnormality. No discrete or worrisome osseous lesions. Moderate multilevel cervical spondylosis without high-grade spinal stenosis. Other neck: No other acute soft tissue abnormality within the neck. No mass lesion or adenopathy. Upper chest: Visualized upper chest demonstrates no acute finding. Mild upper lobe paraseptal emphysematous changes. Review of the MIP  images confirms the above findings CTA HEAD FINDINGS Anterior circulation: Petrous segments patent bilaterally. Cavernous and supraclinoid ICAs widely patent without stenosis. Subtle 2 mm focal outpouching extending inferiorly from the cavernous right ICA could reflect a small vascular infundibulum versus aneurysm (series 515, image 88). ICA termini well perfused. A1 segments patent bilaterally. Normal anterior communicating artery complex. Anterior cerebral arteries widely patent to their distal aspects. No M1 stenosis or occlusion. Normal MCA bifurcations. Distal MCA branches well perfused and symmetric. Posterior circulation: Dominant left vertebral artery widely patent to the vertebrobasilar junction. Patent left PICA. Hypoplastic right vertebral artery terminates in PICA. Right PICA patent as well. Basilar widely patent to its distal aspect. Superior cerebral arteries patent bilaterally. Both PCAs primarily supplied via the basilar well perfused to their distal aspects. Venous sinuses: Grossly patent allowing for timing the contrast bolus. Anatomic variants: Hypoplastic right vertebral artery terminates in PICA. Review of the MIP images confirms the above findings IMPRESSION: CT HEAD IMPRESSION: Normal head CT for age. No acute intracranial abnormality identified. CTA HEAD AND NECK IMPRESSION: 1. Negative CTA for large vessel occlusion or other acute vascular abnormality. 2. 2 mm focal outpouching extending inferiorly from the cavernous right ICA, which could reflect a small vascular infundibulum versus aneurysm. 3. 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 Left vertebral artery dominant. Right vertebral artery diffusely hypoplastic and terminates in PICA. 4. Emphysema (ICD10-J43.9). Electronically Signed   By:  Rise Mu M.D.   On: 10/19/2020 20:58   MR BRAIN WO CONTRAST  Result Date: 10/20/2020 CLINICAL DATA:  54 year old male presenting with headache and double  vision. Third cranial nerve palsy. EXAM: MRI HEAD WITHOUT CONTRAST TECHNIQUE: Multiplanar, multiecho pulse sequences of the brain and surrounding structures were obtained without intravenous contrast. COMPARISON:  CTA head and neck earlier tonight. FINDINGS: Brain: No restricted diffusion to suggest acute infarction. No midline shift, mass effect, evidence of mass lesion, ventriculomegaly, extra-axial collection or acute intracranial hemorrhage. Cervicomedullary junction and pituitary are within normal limits. Cerebral volume is within normal limits for age. Wallace Cullens and white matter signal is within normal limits for age throughout the brain; Minimal nonspecific white matter T2 and FLAIR hyperintensity. No cortical encephalomalacia or chronic cerebral blood products. Deep gray matter nuclei and brainstem within normal limits. Grossly normal cisternal 3rd nerve segments on series 16. Unremarkable noncontrast appearance of the cavernous sinus. Vascular: Major intracranial vascular flow voids are preserved. Dominant left vertebral artery as on the comparison. Skull and upper cervical spine: Partially visible disc and endplate degeneration at C3-C4. Elsewhere bone marrow signal is within normal limits. Sinuses/Orbits: Normal suprasellar cistern and optic chiasm. Unremarkable noncontrast cavernous sinus. Mildly Disconjugate gaze. Intraorbital soft tissues otherwise appear symmetric and within normal limits. Paranasal sinuses and mastoids are stable and well pneumatized. Other: Visible internal auditory structures appear normal. Visible scalp and face appear negative. IMPRESSION: 1. No acute intracranial abnormality and essentially normal for age noncontrast MRI appearance of the brain. 2. 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 Electronically Signed   By: Odessa Fleming M.D.   On: 10/20/2020 02:57   ECHOCARDIOGRAM COMPLETE  Result Date: 10/20/2020     ECHOCARDIOGRAM REPORT   Patient Name:   Julian Simmons. Date of Exam: 10/20/2020 Medical Rec #:  [id number]          Height:       73.0 in Accession #:    [id number]         Weight:       185.0 lb Date of Birth:  Jul 02, 1966          BSA:          2.082 m Patient Age:    54 years           BP:           124/91 mmHg Patient Gender: M                  HR:           73 bpm. Exam Location:  [facility] Procedure: 2D Echo, Color Doppler and Cardiac Doppler Indications:     I163.9 Stroke  History:         Patient has no prior history of Echocardiogram examinations.                  Elevated PSA.  Sonographer:     Humphrey Rolls RDCS (AE) Referring Phys:  [id number] Rometta Emery Diagnosing Phys: Arnoldo Hooker MD  Sonographer Comments: Suboptimal parasternal window and no subcostal window. Image acquisition challenging due to respiratory motion. IMPRESSIONS  1. Left ventricular ejection fraction, by estimation, is 65 to 70%. The left ventricle has normal function. The left ventricle has no regional wall motion abnormalities. Left ventricular diastolic parameters were normal.  2. Right ventricular systolic function is normal. The right ventricular size is normal.  3. The  mitral valve is normal in structure. Trivial mitral valve regurgitation.  4. The aortic valve is normal in structure. Aortic valve regurgitation is trivial. FINDINGS  Left Ventricle: Left ventricular ejection fraction, by estimation, is 65 to 70%. The left ventricle has normal function. The left ventricle has no regional wall motion abnormalities. The left ventricular internal cavity size was small. There is no left ventricular hypertrophy. Left ventricular diastolic parameters were normal. Right Ventricle: The right ventricular size is normal. No increase in right ventricular wall thickness. Right ventricular systolic function is normal. Left Atrium: Left atrial size was normal in size. Right Atrium: Right atrial size was normal in size. Pericardium: There is no  evidence of pericardial effusion. Mitral Valve: The mitral valve is normal in structure. Trivial mitral valve regurgitation. MV peak gradient, 2.8 mmHg. The mean mitral valve gradient is 1.0 mmHg. Tricuspid Valve: The tricuspid valve is normal in structure. Tricuspid valve regurgitation is trivial. Aortic Valve: The aortic valve is normal in structure. Aortic valve regurgitation is trivial. Aortic valve mean gradient measures 4.0 mmHg. Aortic valve peak gradient measures 6.7 mmHg. Aortic valve area, by VTI measures 2.00 cm. Pulmonic Valve: The pulmonic valve was normal in structure. Pulmonic valve regurgitation is not visualized. Aorta: The aortic root and ascending aorta are structurally normal, with no evidence of dilitation. IAS/Shunts: No atrial level shunt detected by color flow Doppler.  LEFT VENTRICLE PLAX 2D LVIDd:         4.66 cm  Diastology LVIDs:         3.05 cm  LV e' medial:    8.70 cm/s LV PW:         1.06 cm  LV E/e' medial:  8.8 LV IVS:        0.61 cm  LV e' lateral:   9.68 cm/s LVOT diam:     1.90 cm  LV E/e' lateral: 7.9 LV SV:         45 LV SV Index:   22 LVOT Area:     2.84 cm  RIGHT VENTRICLE RV Basal diam:  2.71 cm LEFT ATRIUM             Index       RIGHT ATRIUM          Index LA diam:        3.10 cm 1.49 cm/m  RA Area:     9.87 cm LA Vol (A2C):   22.4 ml 10.76 ml/m RA Volume:   18.50 ml 8.89 ml/m LA Vol (A4C):   30.5 ml 14.65 ml/m LA Biplane Vol: 28.3 ml 13.60 ml/m  AORTIC VALVE                   PULMONIC VALVE AV Area (Vmax):    2.11 cm    PV Vmax:       1.45 m/s AV Area (Vmean):   2.09 cm    PV Vmean:      97.900 cm/s AV Area (VTI):     2.00 cm    PV VTI:        0.268 m AV Vmax:           129.00 cm/s PV Peak grad:  8.4 mmHg AV Vmean:          91.800 cm/s PV Mean grad:  5.0 mmHg AV VTI:            0.227 m AV Peak Grad:      6.7 mmHg AV Mean Grad:  4.0 mmHg LVOT Vmax:         96.00 cm/s LVOT Vmean:        67.600 cm/s LVOT VTI:          0.160 m LVOT/AV VTI ratio: 0.70  AORTA Ao  Root diam: 3.10 cm MITRAL VALVE MV Area (PHT): 3.30 cm    SHUNTS MV Peak grad:  2.8 mmHg    Systemic VTI:  0.16 m MV Mean grad:  1.0 mmHg    Systemic Diam: 1.90 cm MV Vmax:       0.84 m/s MV Vmean:      54.3 cm/s MV Decel Time: 230 msec MV E velocity: 76.70 cm/s MV A velocity: 79.70 cm/s MV E/A ratio:  0.96 Arnoldo Hooker MD Electronically signed by Arnoldo Hooker MD Signature Date/Time: 10/20/2020/3:24:33 PM    Final      CODE STATUS:     Code Status Orders  (From admission, onward)         Start     Ordered   10/20/20 0121  Full code  Continuous        10/20/20 0120        Code Status History    Date Active Date Inactive Code Status Order ID Comments User Context   10/19/2020 2125 10/20/2020 0120 Full Code [id number]  Gilles Chiquito, MD ED   Advance Care Planning Activity       TOTAL TIME TAKING CARE OF THIS PATIENT: *35* minutes.    Enedina Finner M.D  [facility]    CC: Primary care physician; Aida Puffer, MD

## 2020-10-21 NOTE — Discharge Instructions (Signed)
Follow-up with ophthalmology if you're left eye weakness does not improve in few weeks continue wearing left eye patch abstain from drinking alcohol avoid smoking

## 2020-10-21 NOTE — Plan of Care (Signed)

## 2021-11-16 DIAGNOSIS — I10 Essential (primary) hypertension: Secondary | ICD-10-CM | POA: Diagnosis not present

## 2021-11-16 DIAGNOSIS — F418 Other specified anxiety disorders: Secondary | ICD-10-CM | POA: Diagnosis not present

## 2021-11-16 DIAGNOSIS — G4700 Insomnia, unspecified: Secondary | ICD-10-CM | POA: Diagnosis not present

## 2021-11-16 DIAGNOSIS — E7800 Pure hypercholesterolemia, unspecified: Secondary | ICD-10-CM | POA: Diagnosis not present

## 2021-12-04 IMAGING — CT CT ANGIO NECK
2 of 11 series · 7 of 33 positions shown · IV contrast (omnipaque)
Comparison: None available.

CLINICAL DATA: Initial evaluation for headache, double vision.

EXAM:
CT ANGIOGRAPHY HEAD AND NECK
TECHNIQUE: Multidetector CT imaging of the head and neck was performed using
the standard protocol during bolus administration of intravenous
contrast. Multiplanar CT image reconstructions and MIPs were
obtained to evaluate the vascular anatomy. Carotid stenosis
measurements (when applicable) are obtained utilizing NASCET
criteria, using the distal internal carotid diameter as the
denominator.
CONTRAST:  75mL OMNIPAQUE IOHEXOL 350 MG/ML SOLN

[Series 509: cta head neck thins · axial · 0.39mm/px · z∈[+396,+647]mm · 5 of 755 slices shown]
[im 126/755  soft-tissue]
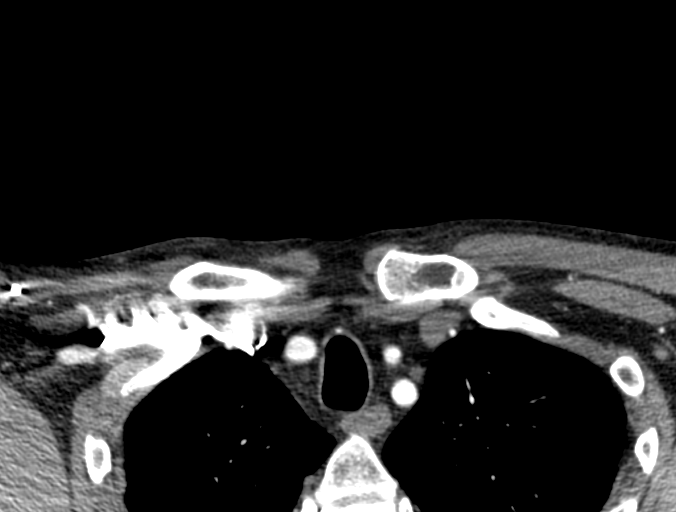
[im 252/755  bone]
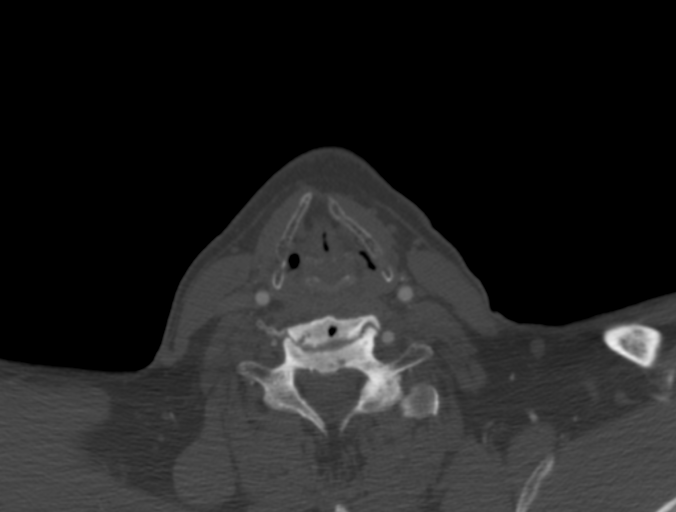
[im 378/755  soft-tissue]
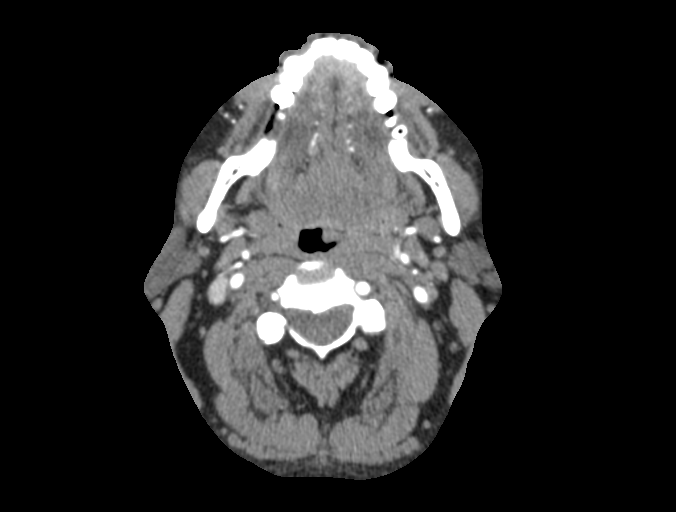
[im 503/755  bone]
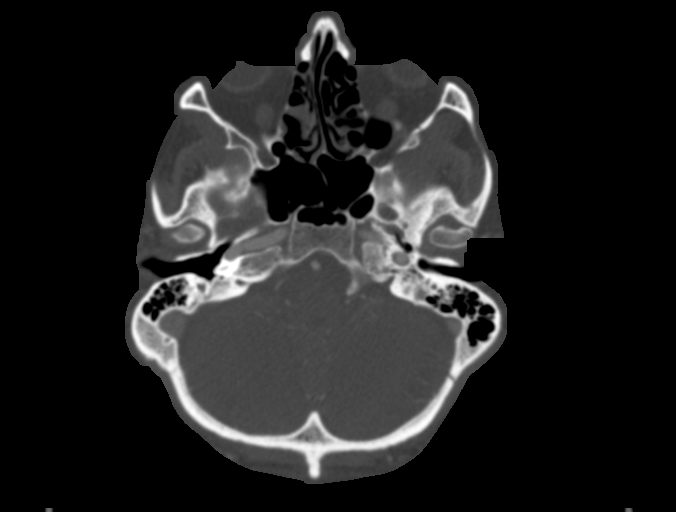
[im 629/755  soft-tissue]
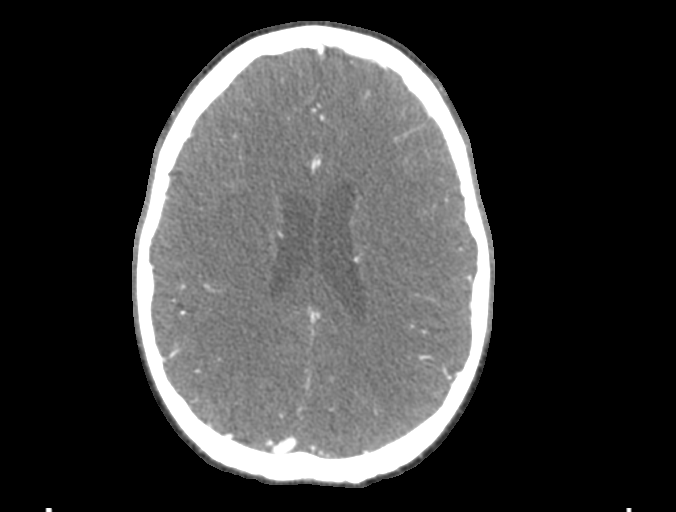

[Series 510: ax thin · axial · 0.39mm/px · z∈[+458,+584]mm · 2 of 378 slices shown]
[im 126/378  soft-tissue]
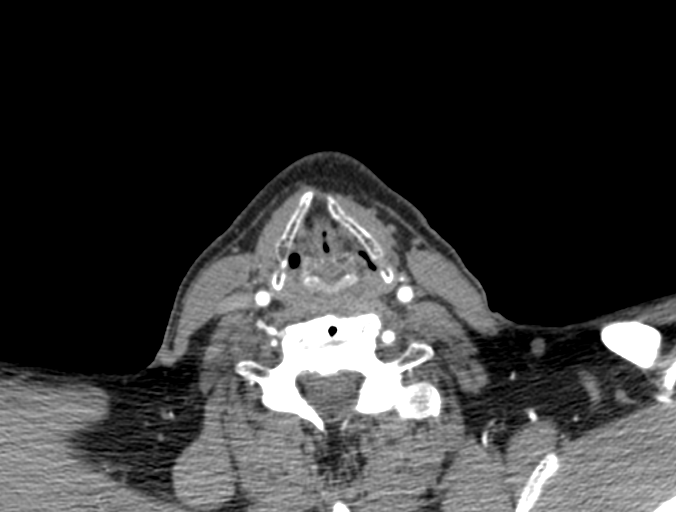
[im 252/378  soft-tissue]
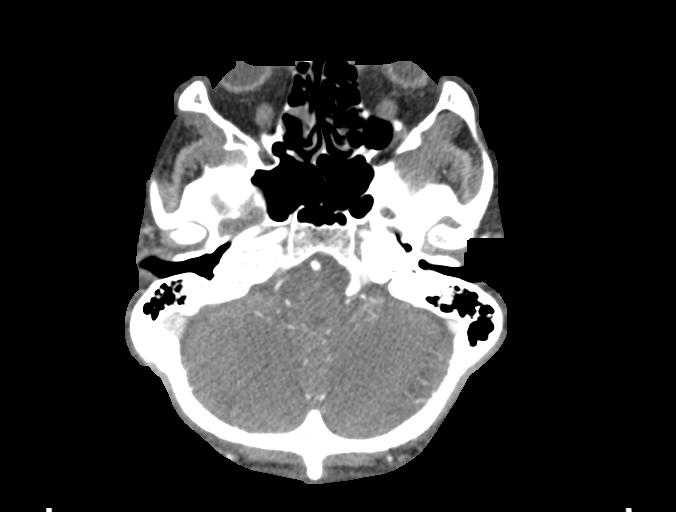

[7 of 33 positions shown; findings below may reference images not displayed]

FINDINGS: CT HEAD FINDINGS

Brain: Cerebral volume within normal limits for patient age.

No evidence for acute intracranial hemorrhage. No findings to
suggest acute large vessel territory infarct. No mass lesion,
midline shift, or mass effect. Ventricles are normal in size without
evidence for hydrocephalus. No extra-axial fluid collection
identified.

Vascular: No hyperdense vessel identified.

Skull: Scalp soft tissues demonstrate no acute abnormality.
Calvarium intact.

Sinuses/Orbits: Globes and orbital soft tissues within normal
limits.

Visualized paranasal sinuses are clear. No mastoid effusion.

CTA NECK FINDINGS

Aortic arch: Visualized aortic arch of normal caliber with normal 3
vessel morphology. Mild atheromatous change at the origin of the
left subclavian artery without significant stenosis. No
hemodynamically significant stenosis about the origin of the great
vessels.

Right carotid system: Right common and internal carotid arteries
widely patent without stenosis, dissection or occlusion. Mild
atheromatous irregularity about the right bifurcation without
significant stenosis.

Left carotid system: Left common and internal carotid arteries
widely patent without stenosis, dissection, or occlusion. Mild
atheromatous irregularity about the left bifurcation without
significant stenosis.

Vertebral arteries: Both vertebral arteries arise from the
subclavian arteries. No proximal subclavian artery stenosis.
Atheromatous plaque at the origin of the left vertebral artery with
associated stenosis of up to 40%. Left vertebral artery dominant,
with a diffusely hypoplastic right vertebral artery. Vertebral
arteries otherwise patent within the neck without stenosis,
dissection or occlusion.

Skeleton: No acute visible acute osseous abnormality. No discrete or
worrisome osseous lesions. Moderate multilevel cervical spondylosis
without high-grade spinal stenosis.

Other neck: No other acute soft tissue abnormality within the neck.
No mass lesion or adenopathy.

Upper chest: Visualized upper chest demonstrates no acute finding.
Mild upper lobe paraseptal emphysematous changes.

Review of the MIP images confirms the above findings

CTA HEAD FINDINGS

Anterior circulation: Petrous segments patent bilaterally. Cavernous
and supraclinoid ICAs widely patent without stenosis. Subtle 2 mm
focal outpouching extending inferiorly from the cavernous right ICA
could reflect a small vascular infundibulum versus aneurysm (series
515, image 88). ICA termini well perfused. A1 segments patent
bilaterally. Normal anterior communicating artery complex. Anterior
cerebral arteries widely patent to their distal aspects. No M1
stenosis or occlusion. Normal MCA bifurcations. Distal MCA branches
well perfused and symmetric.

Posterior circulation: Dominant left vertebral artery widely patent
to the vertebrobasilar junction. Patent left PICA. Hypoplastic right
vertebral artery terminates in PICA. Right PICA patent as well.
Basilar widely patent to its distal aspect. Superior cerebral
arteries patent bilaterally. Both PCAs primarily supplied via the
basilar well perfused to their distal aspects.

Venous sinuses: Grossly patent allowing for timing the contrast
bolus.

Anatomic variants: Hypoplastic right vertebral artery terminates in
PICA.

Review of the MIP images confirms the above findings
IMPRESSION: CT HEAD IMPRESSION:

Normal head CT for age. No acute intracranial abnormality
identified.

CTA HEAD AND NECK IMPRESSION:

1. Negative CTA for large vessel occlusion or other acute vascular
abnormality.
2. 2 mm focal outpouching extending inferiorly from the cavernous
right ICA, which could reflect a small vascular infundibulum versus
aneurysm.
3. 40% atheromatous stenosis at the origin of the left vertebral
artery. Left vertebral artery dominant. Right vertebral artery
diffusely hypoplastic and terminates in PICA.
4. Emphysema ([id number]).

## 2022-01-11 DIAGNOSIS — Z79899 Other long term (current) drug therapy: Secondary | ICD-10-CM | POA: Diagnosis not present

## 2022-01-11 DIAGNOSIS — F418 Other specified anxiety disorders: Secondary | ICD-10-CM | POA: Diagnosis not present

## 2022-01-11 DIAGNOSIS — G4700 Insomnia, unspecified: Secondary | ICD-10-CM | POA: Diagnosis not present

## 2022-01-11 DIAGNOSIS — Z0000 Encounter for general adult medical examination without abnormal findings: Secondary | ICD-10-CM | POA: Diagnosis not present

## 2022-01-11 DIAGNOSIS — E7800 Pure hypercholesterolemia, unspecified: Secondary | ICD-10-CM | POA: Diagnosis not present

## 2022-07-12 ENCOUNTER — Other Ambulatory Visit: Payer: Self-pay | Admitting: Family Medicine

## 2022-07-12 DIAGNOSIS — G4700 Insomnia, unspecified: Secondary | ICD-10-CM | POA: Diagnosis not present

## 2022-07-12 DIAGNOSIS — Z87891 Personal history of nicotine dependence: Secondary | ICD-10-CM | POA: Diagnosis not present

## 2022-07-12 DIAGNOSIS — F418 Other specified anxiety disorders: Secondary | ICD-10-CM | POA: Diagnosis not present

## 2022-07-12 DIAGNOSIS — Z79899 Other long term (current) drug therapy: Secondary | ICD-10-CM | POA: Diagnosis not present

## 2022-07-12 DIAGNOSIS — E782 Mixed hyperlipidemia: Secondary | ICD-10-CM | POA: Diagnosis not present

## 2022-07-12 DIAGNOSIS — I10 Essential (primary) hypertension: Secondary | ICD-10-CM | POA: Diagnosis not present

## 2022-07-25 DIAGNOSIS — Z1212 Encounter for screening for malignant neoplasm of rectum: Secondary | ICD-10-CM | POA: Diagnosis not present

## 2022-07-25 DIAGNOSIS — Z1211 Encounter for screening for malignant neoplasm of colon: Secondary | ICD-10-CM | POA: Diagnosis not present

## 2023-01-13 DIAGNOSIS — F418 Other specified anxiety disorders: Secondary | ICD-10-CM | POA: Diagnosis not present

## 2023-01-13 DIAGNOSIS — Z79899 Other long term (current) drug therapy: Secondary | ICD-10-CM | POA: Diagnosis not present

## 2023-01-13 DIAGNOSIS — Z0000 Encounter for general adult medical examination without abnormal findings: Secondary | ICD-10-CM | POA: Diagnosis not present

## 2023-01-13 DIAGNOSIS — I10 Essential (primary) hypertension: Secondary | ICD-10-CM | POA: Diagnosis not present

## 2023-01-13 DIAGNOSIS — E782 Mixed hyperlipidemia: Secondary | ICD-10-CM | POA: Diagnosis not present

## 2023-02-19 ENCOUNTER — Encounter: Payer: Self-pay | Admitting: Family Medicine

## 2023-02-21 ENCOUNTER — Ambulatory Visit
Admission: RE | Admit: 2023-02-21 | Discharge: 2023-02-21 | Disposition: A | Discharge status: Home / Self Care | Payer: BC Managed Care – PPO | Source: Ambulatory Visit | Attending: Family Medicine | Admitting: Family Medicine

## 2023-02-21 DIAGNOSIS — F17210 Nicotine dependence, cigarettes, uncomplicated: Secondary | ICD-10-CM | POA: Diagnosis not present

## 2023-02-21 DIAGNOSIS — Z87891 Personal history of nicotine dependence: Secondary | ICD-10-CM

## 2023-02-21 MED ORDER — IOPAMIDOL (ISOVUE-300) INJECTION 61%
100.0000 mL | Freq: Once | INTRAVENOUS | Status: AC | PRN
  Administered 2023-02-21: 100 mL via INTRAVENOUS

## 2023-07-18 DIAGNOSIS — I10 Essential (primary) hypertension: Secondary | ICD-10-CM | POA: Diagnosis not present

## 2023-07-18 DIAGNOSIS — R7303 Prediabetes: Secondary | ICD-10-CM | POA: Diagnosis not present

## 2023-07-18 DIAGNOSIS — E782 Mixed hyperlipidemia: Secondary | ICD-10-CM | POA: Diagnosis not present

## 2023-07-18 DIAGNOSIS — I700 Atherosclerosis of aorta: Secondary | ICD-10-CM | POA: Diagnosis not present

## 2023-07-18 DIAGNOSIS — Z79899 Other long term (current) drug therapy: Secondary | ICD-10-CM | POA: Diagnosis not present

## 2023-12-22 ENCOUNTER — Ambulatory Visit: Payer: Self-pay | Admitting: Surgery

## 2023-12-22 DIAGNOSIS — K4090 Unilateral inguinal hernia, without obstruction or gangrene, not specified as recurrent: Secondary | ICD-10-CM | POA: Diagnosis not present

## 2023-12-28 NOTE — Patient Instructions (Addendum)
 SURGICAL WAITING ROOM VISITATION Patients having surgery or a procedure may have no more than 2 support people in the waiting area - these visitors may rotate in the visitor waiting room.   Due to an increase in RSV and influenza rates and associated hospitalizations, children ages 12 and under may not visit patients in [facility] hospitals. If the patient needs to stay at the hospital during part of their recovery, the visitor guidelines for inpatient rooms apply.  PRE-OP VISITATION  Pre-op nurse will coordinate an appropriate time for 1 support person to accompany the patient in pre-op.  This support person may not rotate.  This visitor will be contacted when the time is appropriate for the visitor to come back in the pre-op area.  Please refer to the [facility] website for the visitor guidelines for Inpatients (after your surgery is over and you are in a regular room).  You are not required to quarantine at this time prior to your surgery. However, you must do this: Hand Hygiene often Do NOT share personal items Notify your provider if you are in close contact with someone who has COVID or you develop fever 100.4 or greater, new onset of sneezing, cough, sore throat, shortness of breath or body aches.  If you test positive for Covid or have been in contact with anyone that has tested positive in the last 10 days please notify you surgeon.    Your procedure is scheduled on:  Tuesday  January 20, 2024  Report to [facility] Main Entrance: [address] entrance where the [employer] is available.   Report to admitting at:07:15     AM  Call this number if you have any questions or problems the morning of surgery [phone]  Have a clear liquid diet the day before your surgery.  Do not eat food after Midnight the night prior to your surgery/procedure.  After Midnight you may have the following liquids until   06:30 AM  DAY OF SURGERY  Clear Liquid Diet Water  Black Coffee  (sugar ok, NO MILK/CREAM OR CREAMERS)  Tea (sugar ok, NO MILK/CREAM OR CREAMERS) regular and decaf                             Plain Jell-O  with no fruit (NO RED)                                           Fruit ices (not with fruit pulp, NO RED)                                     Popsicles (NO RED)                                                                  Juice: NO CITRUS JUICES: only apple, WHITE grape, WHITE cranberry Sports drinks like Gatorade or Powerade (NO RED)                FOLLOW ANY ADDITIONAL PRE OP INSTRUCTIONS YOU RECEIVED FROM YOUR  SURGEON'S OFFICE!!!   Oral Hygiene is also important to reduce your risk of infection.        Remember - BRUSH YOUR TEETH THE MORNING OF SURGERY WITH YOUR REGULAR TOOTHPASTE  Do NOT smoke after Midnight the night before surgery.  STOP TAKING all Vitamins, Herbs and supplements 1 week before your surgery.   STOP taking ASPIRIN  5-7 days before surgery.  Take ONLY these medicines the morning of surgery with A SIP OF WATER : Paroxetine  (Paxil ), carvedilol  (Coreg ).                     You may not have any metal on your body including  jewelry, and body piercing  Do not wear lotions, powders, cologne, or deodorant  Men may shave face and neck.  Contacts, Hearing Aids, dentures or bridgework may not be worn into surgery. DENTURES WILL BE REMOVED PRIOR TO SURGERY PLEASE DO NOT APPLY Poly grip OR ADHESIVES!!!  Patients discharged on the day of surgery will not be allowed to drive home.  Someone NEEDS to stay with you for the first 24 hours after anesthesia.  Do not bring your home medications to the hospital. The Pharmacy will dispense medications listed on your medication list to you during your admission in the Hospital.  Please read over the following fact sheets you were given: IF YOU HAVE QUESTIONS ABOUT YOUR PRE-OP INSTRUCTIONS, PLEASE CALL [phone]   [facility] - Preparing for Surgery Before surgery, you can play an  important role.  Because skin is not sterile, your skin needs to be as free of germs as possible.  You can reduce the number of germs on your skin by washing with CHG (chlorahexidine gluconate) soap before surgery.  CHG is an antiseptic cleaner which kills germs and bonds with the skin to continue killing germs even after washing. Please DO NOT use if you have an allergy to CHG or antibacterial soaps.  If your skin becomes reddened/irritated stop using the CHG and inform your nurse when you arrive at Short Stay. Do not shave (including legs and underarms) for at least 48 hours prior to the first CHG shower.  You may shave your face/neck.  Please follow these instructions carefully:  1.  Shower with CHG Soap the night before surgery and the  morning of surgery.  2.  If you choose to wash your hair, wash your hair first as usual with your normal  shampoo.  3.  After you shampoo, rinse your hair and body thoroughly to remove the shampoo.                             4.  Use CHG as you would any other liquid soap.  You can apply chg directly to the skin and wash.  Gently with a scrungie or clean washcloth.  5.  Apply the CHG Soap to your body ONLY FROM THE NECK DOWN.   Do not use on face/ open                           Wound or open sores. Avoid contact with eyes, ears mouth and genitals (private parts).                       Wash face,  Genitals (private parts) with your normal soap.  6.  Wash thoroughly, paying special attention to the area where your  surgery  will be performed.  7.  Thoroughly rinse your body with warm water  from the neck down.  8.  DO NOT shower/wash with your normal soap after using and rinsing off the CHG Soap.            9.  Pat yourself dry with a clean towel.            10.  Wear clean pajamas.            11.  Place clean sheets on your bed the night of your first shower and do not  sleep with pets.  ON THE DAY OF SURGERY : Do not apply any lotions/deodorants the  morning of surgery.  Please wear clean clothes to the hospital/surgery center.     FAILURE TO FOLLOW THESE INSTRUCTIONS MAY RESULT IN THE CANCELLATION OF YOUR SURGERY  PATIENT SIGNATURE_________________________________  NURSE SIGNATURE__________________________________  ________________________________________________________________________

## 2023-12-28 NOTE — Progress Notes (Addendum)
 COVID Vaccine received:  []  No [x]  Yes Date of any COVID positive Test in last 90 days:  None  PCP - Charlene Single, MD  [phone]  fax:[phone]  Cardiologist - none  Chest x-ray - 07-05-2016  2v Epic EKG -  10-19-2020  Epic Stress Test -  ECHO - 10-20-2020  Epic Cardiac Cath -   PCR screen: []  Ordered & Completed []   No Order but Needs PROFEND     [x]   N/A for this surgery  Surgery Plan:  [x]  Ambulatory   []  Outpatient in bed  []  Admit Anesthesia:    [x]  General  []  Spinal  []   Choice []   MAC  Bowel Prep - [x]  No  []   Yes ___clear liquids day before___  Pacemaker / ICD device [x]  No []  Yes   Spinal Cord Stimulator:[x]  No []  Yes       History of Sleep Apnea? [x]  No []  Yes   CPAP used?- [x]  No []  Yes    Does the patient monitor blood sugar?   []  N/A   [x]  No []  Yes  Patient has: []  NO Hx DM   [x]  Pre-DM   []  DM1  []   DM2 Last A1c was: 6.0 on 10-20-2020      Blood Thinner / Instructions:  none Aspirin  Instructions:  ASA 81 mg  stop 1 week before.   ERAS Protocol Ordered: []  No  [x]  Yes PRE-SURGERY []  ENSURE  []  G2   [x]  No Drink Ordered Patient is to be NPO after: 0630  Dental hx: []  Dentures:  []  N/A      [x]  Bridge or Partial: top front 3 teeth Partial denture                  []  Loose or Damaged teeth:   Activity level: Patient is able to climb a flight of stairs without difficulty; [x]  No CP  [x]  No SOB  Patient can  perform ADLs without assistance.   Anesthesia review: HTN, Depression, CN III palsy- left eye ptosis, ETOH, smoker, Pre-DM- no meds  Patient's BP was high at PST  148/96  right arm sitting and 145/90 left arm sitting. His BP at Dr. Milta office 12-22-23  was 154/105. Patient says he hasn't taken his BP meds today and that he has a lot of stressors; his 22 yo grandson is at [employer] with aggressive leukemia. Patient also smokes 1 ppd and drinks a lot of caffeine . I educated him on how smoking and caffeine  effects his BP. I also advised him to  contact his PCP, Dr. Single about his BP. He voiced agreement and understanding.   Patient denies shortness of breath, fever, cough and chest pain at PAT appointment.  Patient verbalized understanding and agreement to the Pre-Surgical Instructions that were given to them at this PAT appointment. Patient was also educated of the need to review these PAT instructions again prior to his surgery.I reviewed the appropriate phone numbers to call if they have any and questions or concerns.

## 2023-12-30 ENCOUNTER — Encounter (HOSPITAL_COMMUNITY): Payer: Self-pay

## 2023-12-30 ENCOUNTER — Other Ambulatory Visit: Payer: Self-pay

## 2023-12-30 ENCOUNTER — Encounter (HOSPITAL_COMMUNITY)
Admission: RE | Admit: 2023-12-30 | Discharge: 2023-12-30 | Disposition: A | Discharge status: Home / Self Care | Payer: BC Managed Care – PPO | Source: Ambulatory Visit | Attending: Surgery | Admitting: Surgery

## 2023-12-30 VITALS — BP 145/90 | HR 90 | Temp 98.9°F | Resp 20 | Ht 72.0 in | Wt 173.0 lb

## 2023-12-30 DIAGNOSIS — F1010 Alcohol abuse, uncomplicated: Secondary | ICD-10-CM | POA: Insufficient documentation

## 2023-12-30 DIAGNOSIS — I10 Essential (primary) hypertension: Secondary | ICD-10-CM | POA: Insufficient documentation

## 2023-12-30 DIAGNOSIS — F17200 Nicotine dependence, unspecified, uncomplicated: Secondary | ICD-10-CM | POA: Insufficient documentation

## 2023-12-30 DIAGNOSIS — Z01818 Encounter for other preprocedural examination: Secondary | ICD-10-CM | POA: Diagnosis not present

## 2023-12-30 HISTORY — DX: Myoneural disorder, unspecified: G70.9

## 2023-12-30 HISTORY — DX: Peripheral vascular disease, unspecified: I73.9

## 2023-12-30 HISTORY — DX: Prediabetes: R73.03

## 2023-12-30 HISTORY — DX: Essential (primary) hypertension: I10

## 2023-12-30 HISTORY — DX: Personal history of other endocrine, nutritional and metabolic disease: Z86.39

## 2023-12-30 HISTORY — DX: Other psychoactive substance abuse, uncomplicated: F19.10

## 2023-12-30 HISTORY — DX: Alcohol abuse, uncomplicated: F10.10

## 2023-12-30 HISTORY — DX: Depression, unspecified: F32.A

## 2023-12-30 LAB — COMPREHENSIVE METABOLIC PANEL
ALT: 19 U/L (ref 0–44)
AST: 20 U/L (ref 15–41)
Albumin: 4.4 g/dL (ref 3.5–5.0)
Alkaline Phosphatase: 70 U/L (ref 38–126)
Anion gap: 9 (ref 5–15)
BUN: 12 mg/dL (ref 6–20)
CO2: 23 mmol/L (ref 22–32)
Calcium: 9.6 mg/dL (ref 8.9–10.3)
Chloride: 103 mmol/L (ref 98–111)
Creatinine, Ser: 0.86 mg/dL (ref 0.61–1.24)
GFR, Estimated: >60 mL/min (ref >60)
Glucose, Bld: 113 mg/dL — ABNORMAL HIGH (ref 70–99)
Potassium: 4.1 mmol/L (ref 3.5–5.1)
Sodium: 135 mmol/L (ref 135–145)
Total Bilirubin: 0.7 mg/dL (ref 0.0–1.2)
Total Protein: 7.8 g/dL (ref 6.5–8.1)

## 2023-12-30 LAB — CBC
HCT: 48.8 % (ref 39.0–52.0)
Hemoglobin: 16.7 g/dL (ref 13.0–17.0)
MCH: 32 pg (ref 26.0–34.0)
MCHC: 34.2 g/dL (ref 30.0–36.0)
MCV: 93.5 fL (ref 80.0–100.0)
Platelets: 269 10*3/uL (ref 150–400)
RBC: 5.22 MIL/uL (ref 4.22–5.81)
RDW: 12.2 % (ref 11.5–15.5)
WBC: 6.8 10*3/uL (ref 4.0–10.5)
nRBC: 0 % (ref 0.0–0.2)

## 2024-01-19 NOTE — H&P (Signed)
Chief Complaint: New Consultation ( Inguinal hernia)  History of Present Illness: Julian Kubick. is a 58 y.o. male who is seen today as an office consultation for evaluation of New Consultation ( Inguinal hernia)  Patient presents for evaluation of large left inguinal scrotal hernia.'s been present for 10 years. Is causing discomfort but no severe pain. Minimal change to bowel or bladder function. He desires repair due to increasing size.  Review of Systems: A complete review of systems was obtained from the patient. I have reviewed this information and discussed as appropriate with the patient. See HPI as well for other ROS.    Medical History: Past Medical History:  Diagnosis Date  Hypertension   There is no problem list on file for this patient.  History reviewed. No pertinent surgical history.   Allergies  Allergen Reactions  Tetanus And Diphther. Tox (Pf) Unknown  Childhood reaction   Current Outpatient Medications on File Prior to Visit  Medication Sig Dispense Refill  carvediloL (COREG) 6.25 MG tablet Take 6.25 mg by mouth every 12 (twelve) hours for blood pressure  lovastatin (MEVACOR) 20 MG tablet Take 20 mg by mouth once daily  PARoxetine (PAXIL) 30 MG tablet TAKE 1 TABLET BY MOUTH ONCE DAILY FOR ANXIETY/ DEPRESSION  temazepam (RESTORIL) 15 mg capsule TAKE 1 CAPSULE BY MOUTH AT BEDTIME FOR INSOMNIA   No current facility-administered medications on file prior to visit.   Family History  Problem Relation Age of Onset  Coronary Artery Disease (Blocked arteries around heart) Mother  Diabetes Mother  Diabetes Sister    Social History   Tobacco Use  Smoking Status Every Day  Types: Cigarettes  Smokeless Tobacco Not on file    Social History   Socioeconomic History  Marital status: Married  Tobacco Use  Smoking status: Every Day  Types: Cigarettes  Vaping Use  Vaping status: Unknown  Substance and Sexual Activity  Alcohol use: Yes   Objective:    Vitals:  12/22/23 0939  BP: (!) 154/105  Pulse: 102  Temp: 36.7 C (98.1 F)  SpO2: 98%  Weight: 79.6 kg (175 lb 6.4 oz)  Height: 180.3 cm (5\' 11" )  PainSc: 0-No pain   Body mass index is 24.46 kg/m.  Physical Exam Vitals reviewed.  Cardiovascular:  Rate and Rhythm: Normal rate.  Pulmonary:  Effort: Pulmonary effort is normal.  Abdominal:  General: Abdomen is flat.  Tenderness: There is no abdominal tenderness.  Hernia: A hernia is present. Hernia is present in the left inguinal area. There is no hernia in the right inguinal area.   Comments: Large left inguinal scrotal hernia reducible when supine  Musculoskeletal:  Cervical back: Normal range of motion.  Skin: General: Skin is warm.  Neurological:  General: No focal deficit present.  Mental Status: He is alert.  Psychiatric:  Mood and Affect: Mood normal.     Assessment and Plan:   Diagnoses and all orders for this visit:  Left inguinal hernia   Plan open repair for a large left inguinal scrotal hernia. Pathophysiology of inguinal hernias reviewed today. Laparoscopic and open techniques discussed today. Use of mesh discussed. Local regional recurrence, chronic pain and mesh complications reviewed. He is a smoker and I have cautioned him about chronic pain in this case of being as high as 10% as well as increased risk of complications and increased risk of recurrence while smoking. Smoking cessation strongly encouraged.  Risk of bleeding, infection, recurrence, nerve injury, blood vessel injury, testicle injury, testicle loss,  injury to bladder, injury to other internal viscera, DVT,, cardiovascular event and rarely death.   Hayden Rasmussen, MD   I spent a total of in both face-to-face and non-face-to-face activities, excluding procedures performed, for this visit on the date of this encounter.

## 2024-01-20 ENCOUNTER — Ambulatory Visit (HOSPITAL_COMMUNITY): Payer: BC Managed Care – PPO | Admitting: Physician Assistant

## 2024-01-20 ENCOUNTER — Encounter (HOSPITAL_COMMUNITY): Payer: Self-pay | Admitting: Surgery

## 2024-01-20 ENCOUNTER — Ambulatory Visit (HOSPITAL_COMMUNITY)
Admission: RE | Admit: 2024-01-20 | Discharge: 2024-01-20 | Disposition: A | Discharge status: Home / Self Care | Payer: BC Managed Care – PPO | Source: Ambulatory Visit | Attending: Surgery | Admitting: Surgery

## 2024-01-20 ENCOUNTER — Encounter (HOSPITAL_COMMUNITY)
Admission: RE | Disposition: A | Discharge status: Home / Self Care | Payer: Self-pay | Source: Ambulatory Visit | Attending: Surgery

## 2024-01-20 ENCOUNTER — Other Ambulatory Visit: Payer: Self-pay

## 2024-01-20 DIAGNOSIS — K4090 Unilateral inguinal hernia, without obstruction or gangrene, not specified as recurrent: Secondary | ICD-10-CM | POA: Insufficient documentation

## 2024-01-20 DIAGNOSIS — I10 Essential (primary) hypertension: Secondary | ICD-10-CM | POA: Diagnosis not present

## 2024-01-20 DIAGNOSIS — F17210 Nicotine dependence, cigarettes, uncomplicated: Secondary | ICD-10-CM | POA: Diagnosis not present

## 2024-01-20 HISTORY — PX: INGUINAL HERNIA REPAIR: SHX194

## 2024-01-20 SURGERY — REPAIR, HERNIA, INGUINAL, ADULT
Anesthesia: General | Site: Inguinal | Laterality: Left

## 2024-01-20 MED ORDER — CHLORHEXIDINE GLUCONATE CLOTH 2 % EX PADS: 6.0000 | MEDICATED_PAD | Freq: Once | CUTANEOUS | Status: DC

## 2024-01-20 MED ORDER — 0.9 % SODIUM CHLORIDE (POUR BTL) OPTIME
TOPICAL | Status: DC | PRN
  Administered 2024-01-20: 1000 mL

## 2024-01-20 MED ORDER — FENTANYL CITRATE (PF) 100 MCG/2ML IJ SOLN
INTRAMUSCULAR | Status: AC
  Filled 2024-01-20: qty 2

## 2024-01-20 MED ORDER — OXYCODONE HCL 5 MG PO TABS: 5.0000 mg | ORAL_TABLET | Freq: Once | ORAL | Status: DC | PRN

## 2024-01-20 MED ORDER — ROCURONIUM BROMIDE 10 MG/ML (PF) SYRINGE
PREFILLED_SYRINGE | INTRAVENOUS | Status: DC | PRN
  Administered 2024-01-20: 10 mg via INTRAVENOUS
  Administered 2024-01-20: 60 mg via INTRAVENOUS

## 2024-01-20 MED ORDER — ONDANSETRON HCL 4 MG/2ML IJ SOLN
INTRAMUSCULAR | Status: AC
  Filled 2024-01-20: qty 2

## 2024-01-20 MED ORDER — FENTANYL CITRATE (PF) 100 MCG/2ML IJ SOLN
INTRAMUSCULAR | Status: DC | PRN
  Administered 2024-01-20: 25 ug via INTRAVENOUS
  Administered 2024-01-20: 50 ug via INTRAVENOUS
  Administered 2024-01-20: 25 ug via INTRAVENOUS
  Administered 2024-01-20: 50 ug via INTRAVENOUS

## 2024-01-20 MED ORDER — MIDAZOLAM HCL 2 MG/2ML IJ SOLN
INTRAMUSCULAR | Status: DC | PRN
  Administered 2024-01-20: 2 mg via INTRAVENOUS

## 2024-01-20 MED ORDER — LIDOCAINE 2% (20 MG/ML) 5 ML SYRINGE
INTRAMUSCULAR | Status: DC | PRN
  Administered 2024-01-20: 80 mg via INTRAVENOUS
  Administered 2024-01-20: 20 mg via INTRAVENOUS

## 2024-01-20 MED ORDER — SUGAMMADEX SODIUM 200 MG/2ML IV SOLN
INTRAVENOUS | Status: DC | PRN
  Administered 2024-01-20: 200 mg via INTRAVENOUS

## 2024-01-20 MED ORDER — ONDANSETRON HCL 4 MG/2ML IJ SOLN
INTRAMUSCULAR | Status: DC | PRN
  Administered 2024-01-20: 4 mg via INTRAVENOUS

## 2024-01-20 MED ORDER — CELECOXIB 200 MG PO CAPS: 200.0000 mg | ORAL_CAPSULE | Freq: Once | ORAL | Status: DC

## 2024-01-20 MED ORDER — ROCURONIUM BROMIDE 10 MG/ML (PF) SYRINGE
PREFILLED_SYRINGE | INTRAVENOUS | Status: AC
  Filled 2024-01-20: qty 10

## 2024-01-20 MED ORDER — PHENYLEPHRINE 80 MCG/ML (10ML) SYRINGE FOR IV PUSH (FOR BLOOD PRESSURE SUPPORT)
PREFILLED_SYRINGE | INTRAVENOUS | Status: DC | PRN
  Administered 2024-01-20: 40 ug via INTRAVENOUS
  Administered 2024-01-20: 80 ug via INTRAVENOUS
  Administered 2024-01-20: 120 ug via INTRAVENOUS
  Administered 2024-01-20: 40 ug via INTRAVENOUS
  Administered 2024-01-20 (×2): 80 ug via INTRAVENOUS
  Administered 2024-01-20: 40 ug via INTRAVENOUS
  Administered 2024-01-20 (×2): 80 ug via INTRAVENOUS

## 2024-01-20 MED ORDER — OXYCODONE HCL 5 MG PO TABS: 5.0000 mg | ORAL_TABLET | Freq: Four times a day (QID) | ORAL | 0 refills | Status: AC | PRN

## 2024-01-20 MED ORDER — ACETAMINOPHEN 325 MG PO TABS: 325.0000 mg | ORAL_TABLET | ORAL | Status: DC | PRN

## 2024-01-20 MED ORDER — CEFAZOLIN SODIUM-DEXTROSE 2-4 GM/100ML-% IV SOLN
2.0000 g | INTRAVENOUS | Status: AC
  Administered 2024-01-20: 2 g via INTRAVENOUS
  Filled 2024-01-20: qty 100

## 2024-01-20 MED ORDER — IBUPROFEN 800 MG PO TABS: 800.0000 mg | ORAL_TABLET | Freq: Three times a day (TID) | ORAL | 0 refills | Status: AC | PRN

## 2024-01-20 MED ORDER — MIDAZOLAM HCL 2 MG/2ML IJ SOLN
INTRAMUSCULAR | Status: AC
Start: 2024-01-20 — End: ?
  Filled 2024-01-20: qty 2

## 2024-01-20 MED ORDER — PROPOFOL 10 MG/ML IV BOLUS
INTRAVENOUS | Status: DC | PRN
  Administered 2024-01-20: 40 mg via INTRAVENOUS
  Administered 2024-01-20: 160 mg via INTRAVENOUS

## 2024-01-20 MED ORDER — LACTATED RINGERS IV SOLN: INTRAVENOUS | Status: DC

## 2024-01-20 MED ORDER — PROPOFOL 10 MG/ML IV BOLUS
INTRAVENOUS | Status: AC
  Filled 2024-01-20: qty 20

## 2024-01-20 MED ORDER — ONDANSETRON HCL 4 MG/2ML IJ SOLN: 4.0000 mg | Freq: Once | INTRAMUSCULAR | Status: DC | PRN

## 2024-01-20 MED ORDER — ORAL CARE MOUTH RINSE: 15.0000 mL | Freq: Once | OROMUCOSAL | Status: AC

## 2024-01-20 MED ORDER — CHLORHEXIDINE GLUCONATE 0.12 % MT SOLN
15.0000 mL | Freq: Once | OROMUCOSAL | Status: AC
  Administered 2024-01-20: 15 mL via OROMUCOSAL

## 2024-01-20 MED ORDER — FENTANYL CITRATE PF 50 MCG/ML IJ SOSY: 25.0000 ug | PREFILLED_SYRINGE | INTRAMUSCULAR | Status: DC | PRN

## 2024-01-20 MED ORDER — STERILE WATER FOR IRRIGATION IR SOLN
Status: DC | PRN
  Administered 2024-01-20: 1000 mL

## 2024-01-20 MED ORDER — LIDOCAINE HCL (PF) 2 % IJ SOLN
INTRAMUSCULAR | Status: AC
  Filled 2024-01-20: qty 5

## 2024-01-20 MED ORDER — BUPIVACAINE-EPINEPHRINE 0.25% -1:200000 IJ SOLN
INTRAMUSCULAR | Status: AC
  Filled 2024-01-20: qty 1

## 2024-01-20 MED ORDER — BUPIVACAINE-EPINEPHRINE 0.25% -1:200000 IJ SOLN
INTRAMUSCULAR | Status: DC | PRN
  Administered 2024-01-20: 29 mL

## 2024-01-20 MED ORDER — OXYCODONE HCL 5 MG/5ML PO SOLN: 5.0000 mg | Freq: Once | ORAL | Status: DC | PRN

## 2024-01-20 MED ORDER — DEXAMETHASONE SODIUM PHOSPHATE 10 MG/ML IJ SOLN
INTRAMUSCULAR | Status: DC | PRN
  Administered 2024-01-20: 4 mg via INTRAVENOUS

## 2024-01-20 MED ORDER — ACETAMINOPHEN 160 MG/5ML PO SOLN: 325.0000 mg | ORAL | Status: DC | PRN

## 2024-01-20 MED ORDER — DEXAMETHASONE SODIUM PHOSPHATE 10 MG/ML IJ SOLN
INTRAMUSCULAR | Status: AC
  Filled 2024-01-20: qty 1

## 2024-01-20 MED ORDER — MEPERIDINE HCL 50 MG/ML IJ SOLN
6.2500 mg | INTRAMUSCULAR | Status: DC | PRN
Start: 2024-01-20 — End: 2024-01-20

## 2024-01-20 MED ORDER — ACETAMINOPHEN 500 MG PO TABS: 1000.0000 mg | ORAL_TABLET | Freq: Once | ORAL | Status: DC

## 2024-01-20 SURGICAL SUPPLY — 35 items
BENZOIN TINCTURE PRP APPL 2/3 (GAUZE/BANDAGES/DRESSINGS) IMPLANT
BLADE HEX COATED 2.75 (ELECTRODE) ×1 IMPLANT
BLADE SURG 15 STRL LF DISP TIS (BLADE) ×1 IMPLANT
DERMABOND ADVANCED .7 DNX12 (GAUZE/BANDAGES/DRESSINGS) IMPLANT
DRAIN PENROSE 0.5X18 (DRAIN) ×1 IMPLANT
DRAPE LAPAROTOMY TRNSV 102X78 (DRAPES) ×1 IMPLANT
ELECT REM PT RETURN 15FT ADLT (MISCELLANEOUS) ×1 IMPLANT
GAUZE SPONGE 4X4 12PLY STRL (GAUZE/BANDAGES/DRESSINGS) IMPLANT
GLOVE BIOGEL PI IND STRL 7.0 (GLOVE) ×1 IMPLANT
GLOVE INDICATOR 8.0 STRL GRN (GLOVE) ×2 IMPLANT
GLOVE SS BIOGEL STRL SZ 8 (GLOVE) ×1 IMPLANT
GOWN STRL REUS W/ TWL XL LVL3 (GOWN DISPOSABLE) ×2 IMPLANT
KIT BASIN OR (CUSTOM PROCEDURE TRAY) ×1 IMPLANT
KIT TURNOVER KIT A (KITS) IMPLANT
MARKER SKIN DUAL TIP RULER LAB (MISCELLANEOUS) ×1 IMPLANT
MESH HERNIA SYS ULTRAPRO LRG (Mesh General) IMPLANT
NDL HYPO 25X1 1.5 SAFETY (NEEDLE) ×1 IMPLANT
NEEDLE HYPO 25X1 1.5 SAFETY (NEEDLE) ×1
NS IRRIG 1000ML POUR BTL (IV SOLUTION) ×1 IMPLANT
PACK BASIC VI WITH GOWN DISP (CUSTOM PROCEDURE TRAY) ×1 IMPLANT
PENCIL SMOKE EVACUATOR (MISCELLANEOUS) IMPLANT
STRIP CLOSURE SKIN 1/2X4 (GAUZE/BANDAGES/DRESSINGS) IMPLANT
SUT MNCRL AB 4-0 PS2 18 (SUTURE) ×1 IMPLANT
SUT NOVA 0 T19/GS 22DT (SUTURE) IMPLANT
SUT NOVA NAB DX-16 0-1 5-0 T12 (SUTURE) IMPLANT
SUT NOVA NAB GS-21 0 18 T12 DT (SUTURE) ×2 IMPLANT
SUT SILK 2 0 SH (SUTURE) IMPLANT
SUT SILK 3-0 18XBRD TIE 12 (SUTURE) IMPLANT
SUT VIC AB 0 CT1 36 (SUTURE) ×1 IMPLANT
SUT VIC AB 2-0 SH 27X BRD (SUTURE) ×1 IMPLANT
SUT VIC AB 3-0 SH 18 (SUTURE) ×1 IMPLANT
SUT VIC AB 3-0 SH 27XBRD (SUTURE) ×1 IMPLANT
SUT VICRYL 3 0 BR 18 UND (SUTURE) ×1 IMPLANT
SYR BULB IRRIG 60ML STRL (SYRINGE) ×1 IMPLANT
TOWEL OR 17X26 10 PK STRL BLUE (TOWEL DISPOSABLE) ×1 IMPLANT

## 2024-01-20 NOTE — Transfer of Care (Signed)
Immediate Anesthesia Transfer of Care Note  Patient: Julian Simmons.  Procedure(s) Performed: REPAIR LEFT INGUINAL HERNIA WITH MESH (Left: Inguinal)  Patient Location: PACU  Anesthesia Type:General  Level of Consciousness: awake, alert , and patient cooperative  Airway & Oxygen Therapy: Patient Spontanous Breathing and Patient connected to face mask oxygen  Post-op Assessment: Report given to RN and Post -op Vital signs reviewed and stable  Post vital signs: Reviewed and stable  Last Vitals:  Vitals Value Taken Time  BP 151/95 01/20/24 1104  Temp    Pulse 96 01/20/24 1105  Resp 21 01/20/24 1105  SpO2 100 % 01/20/24 1105  Vitals shown include unfiled device data.  Last Pain:  Vitals:   01/20/24 0715  TempSrc: Oral         Complications: No notable events documented.

## 2024-01-20 NOTE — Anesthesia Postprocedure Evaluation (Signed)
Anesthesia Post Note  Patient: Julian Simmons.  Procedure(s) Performed: REPAIR LEFT INGUINAL HERNIA WITH MESH (Left: Inguinal)     Patient location during evaluation: PACU Anesthesia Type: General Level of consciousness: awake and alert Pain management: pain level controlled Vital Signs Assessment: post-procedure vital signs reviewed and stable Respiratory status: spontaneous breathing, nonlabored ventilation and respiratory function stable Cardiovascular status: blood pressure returned to baseline and stable Postop Assessment: no apparent nausea or vomiting Anesthetic complications: no  No notable events documented.  Last Vitals:  Vitals:   01/20/24 1130 01/20/24 1140  BP: (!) 146/97 (!) 153/105  Pulse: 91 87  Resp: 15   Temp: 36.4 C 36.6 C  SpO2: 97% 98%    Last Pain:  Vitals:   01/20/24 1140  TempSrc:   PainSc: 0-No pain                 Antonya Leeder,W. EDMOND

## 2024-01-20 NOTE — Discharge Instructions (Signed)
[facility] _______[facility]  UMBILICAL OR INGUINAL HERNIA REPAIR: POST OP INSTRUCTIONS  Always review your discharge instruction sheet given to you by the facility where your surgery was performed. IF YOU HAVE DISABILITY OR FAMILY LEAVE FORMS, YOU MUST BRING THEM TO THE OFFICE FOR PROCESSING.   DO NOT GIVE THEM TO YOUR DOCTOR.  1. A  prescription for pain medication may be given to you upon discharge.  Take your pain medication as prescribed, if needed.  If narcotic pain medicine is not needed, then you may take acetaminophen (Tylenol) or ibuprofen (Advil) as needed. 2. Take your usually prescribed medications unless otherwise directed. If you need a refill on your pain medication, please contact your pharmacy.  They will contact our office to request authorization. Prescriptions will not be filled after 5 pm or on week-ends. 3. You should follow a light diet the first 24 hours after arrival home, such as soup and crackers, etc.  Be sure to include lots of fluids daily.  Resume your normal diet the day after surgery. 4.Most patients will experience some swelling and bruising around the umbilicus or in the groin and scrotum.  Ice packs and reclining will help.  Swelling and bruising can take several days to resolve.  6. It is common to experience some constipation if taking pain medication after surgery.  Increasing fluid intake and taking a stool softener (such as Colace) will usually help or prevent this problem from occurring.  A mild laxative (Milk of Magnesia or Miralax) should be taken according to package directions if there are no bowel movements after 48 hours. 7. Unless discharge instructions indicate otherwise, you may remove your bandages 24-48 hours after surgery, and you may shower at that time.  You may have steri-strips (small skin tapes) in place directly over the incision.  These strips should be left on the skin for 7-10 days.  If your surgeon used skin glue on the  incision, you may shower in 24 hours.  The glue will flake off over the next 2-3 weeks.  Any sutures or staples will be removed at the office during your follow-up visit. 8. ACTIVITIES:  You may resume regular (light) daily activities beginning the next day--such as daily self-care, walking, climbing stairs--gradually increasing activities as tolerated.  You may have sexual intercourse when it is comfortable.  Refrain from any heavy lifting or straining until approved by your doctor.  a.You may drive when you are no longer taking prescription pain medication, you can comfortably wear a seatbelt, and you can safely maneuver your car and apply brakes. b.RETURN TO WORK:   _____________________________________________  9.You should see your doctor in the office for a follow-up appointment approximately 2-3 weeks after your surgery.  Make sure that you call for this appointment within a day or two after you arrive home to insure a convenient appointment time. 10.OTHER INSTRUCTIONS: _________________________    _____________________________________  WHEN TO CALL YOUR DOCTOR: Fever over 101.0 Inability to urinate Nausea and/or vomiting Extreme swelling or bruising Continued bleeding from incision. Increased pain, redness, or drainage from the incision  The clinic staff is available to answer your questions during regular business hours.  Please don't hesitate to call and ask to speak to one of the nurses for clinical concerns.  If you have a medical emergency, go to the nearest emergency room or call 911.  A surgeon from [facility] is always on call at the hospital   [address], [address],   [place], [place]  [address] ?  [address] [address], [place], [place]   [address] [phone] ? [phone] ? FAX [phone] Web site: [email or web address]  

## 2024-01-20 NOTE — Anesthesia Procedure Notes (Signed)
Procedure Name: Intubation Date/Time: 01/20/2024 9:37 AM  Performed by: Sindy Guadeloupe, CRNAPre-anesthesia Checklist: Patient identified, Emergency Drugs available, Suction available, Patient being monitored and Timeout performed Patient Re-evaluated:Patient Re-evaluated prior to induction Oxygen Delivery Method: Circle system utilized Preoxygenation: Pre-oxygenation with 100% oxygen Induction Type: IV induction Ventilation: Mask ventilation without difficulty Laryngoscope Size: Mac and 4 Grade View: Grade I Tube type: Oral Tube size: 7.5 mm Number of attempts: 1 Airway Equipment and Method: Stylet Placement Confirmation: ETT inserted through vocal cords under direct vision, positive ETCO2 and breath sounds checked- equal and bilateral Secured at: 23 cm Tube secured with: Tape Dental Injury: Teeth and Oropharynx as per pre-operative assessment

## 2024-01-20 NOTE — Op Note (Signed)
Preoperative diagnosis: Reducible initial left inguinal hernia  Postoperative diagnosis: Same  Procedure: Repair of reducible initial left inguinal hernia with mesh  Surgeon: Harriette Bouillon, MD  Anesthesia: General With 0.25% Marcaine with epinephrine local  EBL: Minimal  Specimen: None  Drains: None  Indications for procedure: The patient is a 58 year old male with a large left reducible inguinal scrotal hernia.  He presents today for repair.The risk of hernia repair include bleeding,  Infection,   Recurrence of the hernia,  Mesh use, chronic pain,  Organ injury,  Bowel injury,  Bladder injury,   nerve injury with numbness around the incision,  Death,  and worsening of preexisting  medical problems.  The alternatives to surgery have been discussed as well..  Long term expectations of both operative and non operative treatments have been discussed.   The patient agrees to proceed.    Description of procedure: The patient was met in the holding area questions were answered.  Left groin was marked as correct site.  He was then taken to the operative room and placed supine on the OR table where general anesthesia was initiated.  After induction of general anesthesia, left groin was prepped and draped in sterile fashion as well as the scrotum and timeout performed.  He received appropriate preoperative antibiotics.  A left inguinal crease incision was made.  Dissection is carried down through Scarpa's fascia until the aponeurosis of the external bleak was identified.  A small incision was made and the fibers were opened through the external ring.  A large indirect left inguinal scrotal hernia was identified.  Dissection of the cord was done to place 1/2 inch Penrose drain around this.  The sac extended well into the scrotum.  After tedious dissection we have to dissect the sac away and reduce it back into the preperitoneal space.  A large ultra pro hernia system mesh was used with the inner leaflet  placed into the preperitoneal space and the onlay was placed onto the floor of the inguinal canal.  A small slit was cut the cord structures.  It was secured to the shelving edge of the inguinal ligament, pubic tubercle and conjoined tendon and internal bleak with interrupted #1 Novafil suture.  The slit was closed careful around the cord structures not to impinge it with #1 Novafil.  The ilioinguinal nerve was divided since it was tethered to the mesh to prevent postop entrapment and pain.  There is no bleeding.  There is no undue tension on the cord.  Local anesthetic was infiltrated throughout.  We then closed the aponeurosis of the external bleak with 2-0 Vicryl.  3-0 Vicryl was used to approximate Scarpa's fascia and 4 Monocryl was used to close the skin in a subcuticular fashion.  Dermabond applied.  All counts found to be correct.  The patient was awoke extubated taken to recovery in satisfactory condition.

## 2024-01-20 NOTE — Anesthesia Preprocedure Evaluation (Addendum)
Anesthesia Evaluation  Patient identified by MRN, date of birth, ID band Patient awake    Reviewed: Allergy & Precautions, H&P , NPO status , Patient's Chart, lab work & pertinent test results  Airway Mallampati: II  TM Distance: >3 FB Neck ROM: Full    Dental no notable dental hx. (+) Poor Dentition, Missing, Dental Advisory Given,    Pulmonary neg pulmonary ROS, Current Smoker   Pulmonary exam normal breath sounds clear to auscultation       Cardiovascular Exercise Tolerance: Good hypertension, Pt. on medications + Peripheral Vascular Disease  negative cardio ROS Normal cardiovascular exam Rhythm:Regular Rate:Normal     Neuro/Psych  Headaches PSYCHIATRIC DISORDERS  Depression     Neuromuscular disease negative neurological ROS  negative psych ROS   GI/Hepatic negative GI ROS, Neg liver ROS,,,(+)     substance abuse  alcohol use  Endo/Other  negative endocrine ROS    Renal/GU negative Renal ROS  negative genitourinary   Musculoskeletal negative musculoskeletal ROS (+)    Abdominal   Peds negative pediatric ROS (+)  Hematology negative hematology ROS (+)   Anesthesia Other Findings   Reproductive/Obstetrics negative OB ROS                             Anesthesia Physical Anesthesia Plan  ASA: 2  Anesthesia Plan: General   Post-op Pain Management: Minimal or no pain anticipated, Tylenol PO (pre-op)* and Celebrex PO (pre-op)*   Induction: Intravenous  PONV Risk Score and Plan: 3 and Ondansetron, Dexamethasone and Treatment may vary due to age or medical condition  Airway Management Planned: Oral ETT  Additional Equipment: None  Intra-op Plan:   Post-operative Plan: Extubation in OR  Informed Consent: I have reviewed the patients History and Physical, chart, labs and discussed the procedure including the risks, benefits and alternatives for the proposed anesthesia with the  patient or authorized representative who has indicated his/her understanding and acceptance.       Plan Discussed with: Anesthesiologist and CRNA  Anesthesia Plan Comments: (  )        Anesthesia Quick Evaluation

## 2024-01-20 NOTE — Interval H&P Note (Signed)
History and Physical Interval Note:  01/20/2024 9:24 AM  Julian Simmons.  has presented today for surgery, with the diagnosis of Left Inguinal Hernia.  The various methods of treatment have been discussed with the patient and family. After consideration of risks, benefits and other options for treatment, the patient has consented to  Procedure(s): REPAIR LEFT INGUINAL HERNIA WITH MESH (Left) as a surgical intervention.  The patient's history has been reviewed, patient examined, no change in status, stable for surgery.  I have reviewed the patient's chart and labs.  Questions were answered to the patient's satisfaction.     Julian Simmons

## 2024-01-21 ENCOUNTER — Encounter (HOSPITAL_COMMUNITY): Payer: Self-pay | Admitting: Surgery

## 2024-02-10 ENCOUNTER — Other Ambulatory Visit: Payer: Self-pay | Admitting: Family Medicine

## 2024-02-10 DIAGNOSIS — Z87891 Personal history of nicotine dependence: Secondary | ICD-10-CM

## 2024-02-10 DIAGNOSIS — E782 Mixed hyperlipidemia: Secondary | ICD-10-CM | POA: Diagnosis not present

## 2024-02-10 DIAGNOSIS — I10 Essential (primary) hypertension: Secondary | ICD-10-CM | POA: Diagnosis not present

## 2024-02-10 DIAGNOSIS — R7303 Prediabetes: Secondary | ICD-10-CM | POA: Diagnosis not present

## 2024-02-10 DIAGNOSIS — Z125 Encounter for screening for malignant neoplasm of prostate: Secondary | ICD-10-CM | POA: Diagnosis not present

## 2024-02-10 DIAGNOSIS — Z79899 Other long term (current) drug therapy: Secondary | ICD-10-CM | POA: Diagnosis not present

## 2024-02-10 DIAGNOSIS — I2510 Atherosclerotic heart disease of native coronary artery without angina pectoris: Secondary | ICD-10-CM | POA: Diagnosis not present

## 2024-02-20 ENCOUNTER — Other Ambulatory Visit: Payer: BC Managed Care – PPO

## 2024-03-05 ENCOUNTER — Ambulatory Visit
Admission: RE | Admit: 2024-03-05 | Discharge: 2024-03-05 | Disposition: A | Discharge status: Home / Self Care | Payer: BC Managed Care – PPO | Source: Ambulatory Visit | Attending: Family Medicine | Admitting: Family Medicine

## 2024-03-05 DIAGNOSIS — F17210 Nicotine dependence, cigarettes, uncomplicated: Secondary | ICD-10-CM | POA: Diagnosis not present

## 2024-03-05 DIAGNOSIS — Z122 Encounter for screening for malignant neoplasm of respiratory organs: Secondary | ICD-10-CM | POA: Diagnosis not present

## 2024-03-05 DIAGNOSIS — Z87891 Personal history of nicotine dependence: Secondary | ICD-10-CM

## 2024-04-02 ENCOUNTER — Other Ambulatory Visit: Payer: BC Managed Care – PPO

## 2024-05-14 DIAGNOSIS — R972 Elevated prostate specific antigen [PSA]: Secondary | ICD-10-CM | POA: Diagnosis not present

## 2024-10-15 DIAGNOSIS — Z79899 Other long term (current) drug therapy: Secondary | ICD-10-CM | POA: Diagnosis not present

## 2024-10-15 DIAGNOSIS — R7303 Prediabetes: Secondary | ICD-10-CM | POA: Diagnosis not present

## 2024-10-15 DIAGNOSIS — I2510 Atherosclerotic heart disease of native coronary artery without angina pectoris: Secondary | ICD-10-CM | POA: Diagnosis not present

## 2024-10-15 DIAGNOSIS — E782 Mixed hyperlipidemia: Secondary | ICD-10-CM | POA: Diagnosis not present

## 2024-10-15 DIAGNOSIS — I10 Essential (primary) hypertension: Secondary | ICD-10-CM | POA: Diagnosis not present
